# Patient Record
Sex: Female | Born: 1980
Health system: Southern US, Community
[De-identification: ages and names within clinical notes are randomized; demographics above are authoritative.]

## PROBLEM LIST (undated history)

## (undated) DIAGNOSIS — L409 Psoriasis, unspecified: Secondary | ICD-10-CM

## (undated) DIAGNOSIS — G8929 Other chronic pain: Secondary | ICD-10-CM

## (undated) DIAGNOSIS — N920 Excessive and frequent menstruation with regular cycle: Secondary | ICD-10-CM

## (undated) HISTORY — PX: TUBAL LIGATION: SHX77

## (undated) HISTORY — PX: CERVICAL SPINE SURGERY: SHX589

## (undated) HISTORY — PX: HEAD & NECK WOUND REPAIR / CLOSURE: SUR1142

---

## 2000-09-07 ENCOUNTER — Encounter: Payer: Self-pay | Admitting: Family Medicine

## 2000-09-07 ENCOUNTER — Ambulatory Visit (HOSPITAL_COMMUNITY): Admission: RE | Admit: 2000-09-07 | Discharge: 2000-09-07 | Payer: Self-pay | Admitting: Family Medicine

## 2001-03-29 ENCOUNTER — Other Ambulatory Visit: Admission: RE | Admit: 2001-03-29 | Discharge: 2001-03-29 | Payer: Self-pay | Admitting: Obstetrics and Gynecology

## 2001-05-06 ENCOUNTER — Ambulatory Visit (HOSPITAL_COMMUNITY): Admission: RE | Admit: 2001-05-06 | Discharge: 2001-05-06 | Payer: Self-pay | Admitting: Obstetrics and Gynecology

## 2001-08-31 ENCOUNTER — Inpatient Hospital Stay (HOSPITAL_COMMUNITY): Admission: RE | Admit: 2001-08-31 | Discharge: 2001-09-03 | Payer: Self-pay | Admitting: Internal Medicine

## 2002-07-09 ENCOUNTER — Other Ambulatory Visit: Admission: RE | Admit: 2002-07-09 | Discharge: 2002-07-09 | Payer: Self-pay

## 2002-08-30 ENCOUNTER — Emergency Department (HOSPITAL_COMMUNITY): Admission: EM | Admit: 2002-08-30 | Discharge: 2002-08-30 | Payer: Self-pay | Admitting: *Deleted

## 2002-09-25 ENCOUNTER — Other Ambulatory Visit: Admission: RE | Admit: 2002-09-25 | Discharge: 2002-09-25 | Payer: Self-pay | Admitting: Obstetrics and Gynecology

## 2004-03-20 ENCOUNTER — Emergency Department (HOSPITAL_COMMUNITY): Admission: EM | Admit: 2004-03-20 | Discharge: 2004-03-20 | Payer: Self-pay | Admitting: *Deleted

## 2004-04-20 ENCOUNTER — Emergency Department (HOSPITAL_COMMUNITY): Admission: EM | Admit: 2004-04-20 | Discharge: 2004-04-20 | Payer: Self-pay | Admitting: *Deleted

## 2004-08-31 ENCOUNTER — Emergency Department (HOSPITAL_COMMUNITY): Admission: EM | Admit: 2004-08-31 | Discharge: 2004-08-31 | Payer: Self-pay | Admitting: Emergency Medicine

## 2004-09-20 ENCOUNTER — Emergency Department (HOSPITAL_COMMUNITY): Admission: EM | Admit: 2004-09-20 | Discharge: 2004-09-20 | Payer: Self-pay | Admitting: *Deleted

## 2004-09-25 ENCOUNTER — Emergency Department (HOSPITAL_COMMUNITY): Admission: EM | Admit: 2004-09-25 | Discharge: 2004-09-26 | Payer: Self-pay | Admitting: Emergency Medicine

## 2005-08-10 ENCOUNTER — Emergency Department (HOSPITAL_COMMUNITY): Admission: EM | Admit: 2005-08-10 | Discharge: 2005-08-11 | Payer: Self-pay | Admitting: Emergency Medicine

## 2005-10-13 ENCOUNTER — Ambulatory Visit (HOSPITAL_COMMUNITY): Admission: RE | Admit: 2005-10-13 | Discharge: 2005-10-13 | Payer: Self-pay | Admitting: Obstetrics and Gynecology

## 2005-12-08 ENCOUNTER — Inpatient Hospital Stay (HOSPITAL_COMMUNITY): Admission: RE | Admit: 2005-12-08 | Discharge: 2005-12-10 | Payer: Self-pay | Admitting: Obstetrics and Gynecology

## 2006-11-04 ENCOUNTER — Emergency Department (HOSPITAL_COMMUNITY): Admission: EM | Admit: 2006-11-04 | Discharge: 2006-11-04 | Payer: Self-pay | Admitting: Emergency Medicine

## 2007-05-16 ENCOUNTER — Emergency Department (HOSPITAL_COMMUNITY): Admission: EM | Admit: 2007-05-16 | Discharge: 2007-05-17 | Payer: Self-pay | Admitting: Emergency Medicine

## 2007-06-08 ENCOUNTER — Emergency Department (HOSPITAL_COMMUNITY): Admission: EM | Admit: 2007-06-08 | Discharge: 2007-06-08 | Payer: Self-pay | Admitting: Emergency Medicine

## 2007-07-12 ENCOUNTER — Emergency Department (HOSPITAL_COMMUNITY): Admission: EM | Admit: 2007-07-12 | Discharge: 2007-07-12 | Payer: Self-pay | Admitting: Emergency Medicine

## 2007-12-28 ENCOUNTER — Emergency Department (HOSPITAL_COMMUNITY): Admission: EM | Admit: 2007-12-28 | Discharge: 2007-12-28 | Payer: Self-pay | Admitting: Emergency Medicine

## 2008-01-19 ENCOUNTER — Emergency Department (HOSPITAL_COMMUNITY): Admission: EM | Admit: 2008-01-19 | Discharge: 2008-01-20 | Payer: Self-pay | Admitting: Emergency Medicine

## 2008-01-20 ENCOUNTER — Emergency Department (HOSPITAL_COMMUNITY): Admission: EM | Admit: 2008-01-20 | Discharge: 2008-01-21 | Payer: Self-pay | Admitting: Emergency Medicine

## 2008-02-17 ENCOUNTER — Emergency Department (HOSPITAL_COMMUNITY): Admission: EM | Admit: 2008-02-17 | Discharge: 2008-02-18 | Payer: Self-pay | Admitting: Emergency Medicine

## 2008-03-03 ENCOUNTER — Emergency Department (HOSPITAL_COMMUNITY): Admission: EM | Admit: 2008-03-03 | Discharge: 2008-03-03 | Payer: Self-pay | Admitting: Emergency Medicine

## 2008-03-17 ENCOUNTER — Emergency Department (HOSPITAL_COMMUNITY): Admission: EM | Admit: 2008-03-17 | Discharge: 2008-03-17 | Payer: Self-pay | Admitting: Emergency Medicine

## 2008-04-03 ENCOUNTER — Emergency Department (HOSPITAL_COMMUNITY): Admission: EM | Admit: 2008-04-03 | Discharge: 2008-04-03 | Payer: Self-pay | Admitting: Emergency Medicine

## 2008-05-09 ENCOUNTER — Emergency Department (HOSPITAL_COMMUNITY): Admission: EM | Admit: 2008-05-09 | Discharge: 2008-05-09 | Payer: Self-pay | Admitting: Emergency Medicine

## 2008-05-12 ENCOUNTER — Emergency Department (HOSPITAL_COMMUNITY): Admission: EM | Admit: 2008-05-12 | Discharge: 2008-05-12 | Payer: Self-pay | Admitting: Emergency Medicine

## 2008-05-13 ENCOUNTER — Emergency Department (HOSPITAL_COMMUNITY): Admission: EM | Admit: 2008-05-13 | Discharge: 2008-05-13 | Payer: Self-pay | Admitting: Emergency Medicine

## 2008-05-15 ENCOUNTER — Emergency Department (HOSPITAL_COMMUNITY): Admission: EM | Admit: 2008-05-15 | Discharge: 2008-05-15 | Payer: Self-pay | Admitting: Internal Medicine

## 2008-05-17 ENCOUNTER — Emergency Department (HOSPITAL_COMMUNITY): Admission: EM | Admit: 2008-05-17 | Discharge: 2008-05-17 | Payer: Self-pay | Admitting: Emergency Medicine

## 2008-05-18 ENCOUNTER — Emergency Department (HOSPITAL_COMMUNITY): Admission: EM | Admit: 2008-05-18 | Discharge: 2008-05-18 | Payer: Self-pay | Admitting: Emergency Medicine

## 2008-06-23 ENCOUNTER — Ambulatory Visit (HOSPITAL_COMMUNITY): Admission: RE | Admit: 2008-06-23 | Discharge: 2008-06-23 | Payer: Self-pay | Admitting: Internal Medicine

## 2008-07-25 ENCOUNTER — Emergency Department (HOSPITAL_COMMUNITY): Admission: EM | Admit: 2008-07-25 | Discharge: 2008-07-25 | Payer: Self-pay | Admitting: Emergency Medicine

## 2008-07-27 ENCOUNTER — Emergency Department (HOSPITAL_COMMUNITY): Admission: EM | Admit: 2008-07-27 | Discharge: 2008-07-27 | Payer: Self-pay | Admitting: Emergency Medicine

## 2008-08-14 ENCOUNTER — Emergency Department (HOSPITAL_COMMUNITY): Admission: EM | Admit: 2008-08-14 | Discharge: 2008-08-14 | Payer: Self-pay | Admitting: Emergency Medicine

## 2008-09-24 ENCOUNTER — Emergency Department (HOSPITAL_COMMUNITY): Admission: EM | Admit: 2008-09-24 | Discharge: 2008-09-24 | Payer: Self-pay | Admitting: Emergency Medicine

## 2008-11-23 IMAGING — CT CT PELVIS W/O CM
1 of 2 series · 15 of 32 positions shown, 19 images · IV contrast (agent unspecified)
Comparison: none

CLINICAL DATA: Abdominal and pelvic pain.  Nausea, vomiting.  
 ABDOMEN CT WITHOUT CONTRAST:
TECHNIQUE: Multidetector CT imaging of the abdomen was performed following the standard protocol without IV contrast.  Intravenous contrast could not be administered due to lack of suitable intravenous access in this patient.
TECHNIQUE: Multidetector CT imaging of the pelvis was performed following the standard protocol without IV contrast.

[Series 5: abd|pel w/o 5.0 b40f · axial · non-contrast · 0.68mm/px · z∈[-452,-32]mm · 15 of 94 slices shown, 19 images]
[im 5/94  soft-tissue]
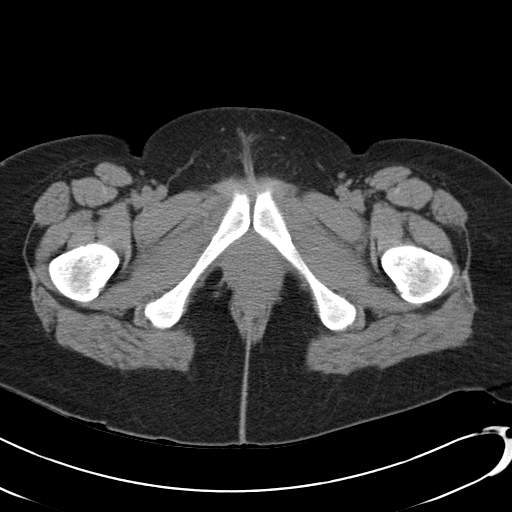
[im 5/94  bone]
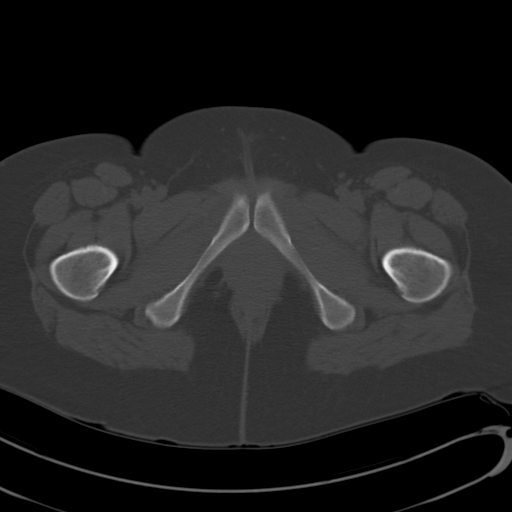
[im 13/94  soft-tissue]
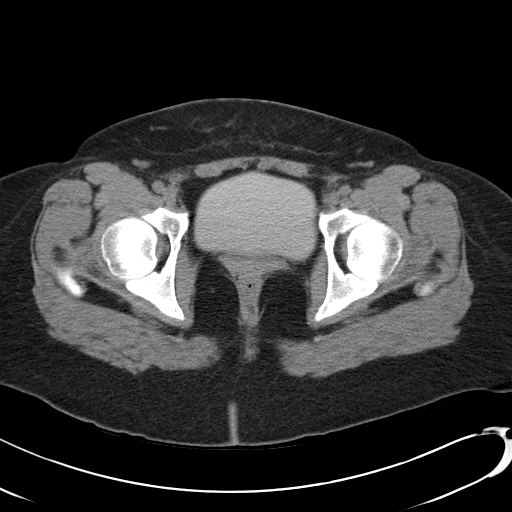
[im 21/94  soft-tissue]
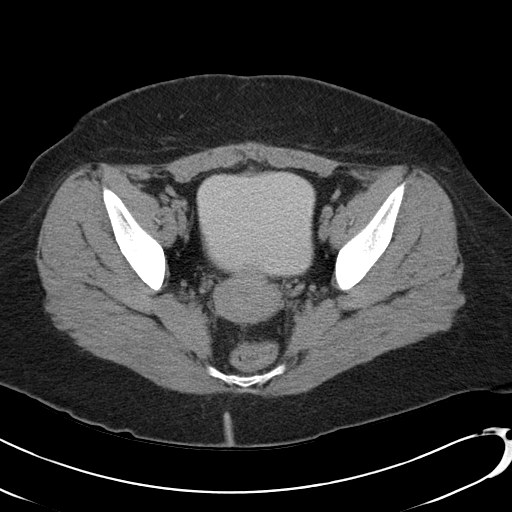
[im 25/94  soft-tissue]
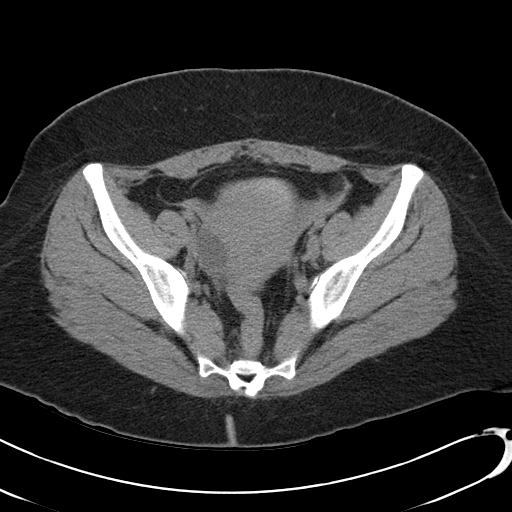
[im 33/94  soft-tissue]
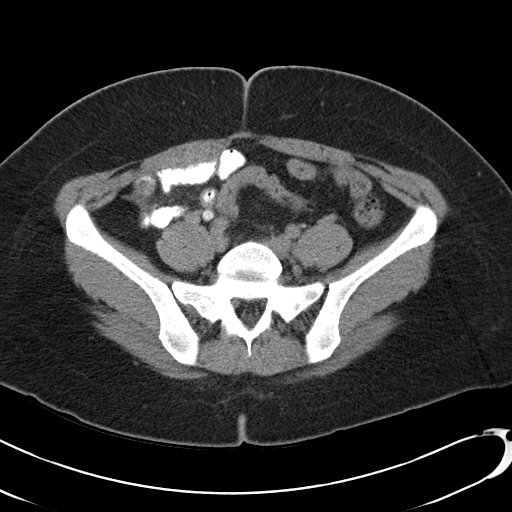
[im 41/94  soft-tissue]
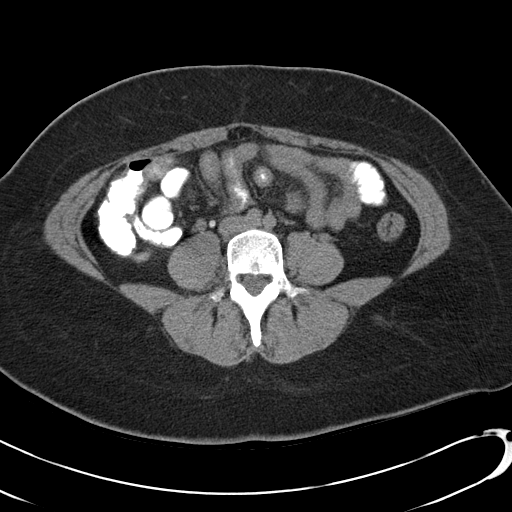
[im 49/94  soft-tissue]
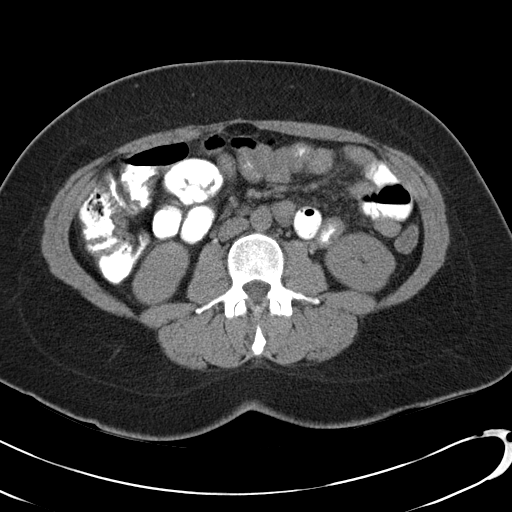
[im 53/94  soft-tissue]
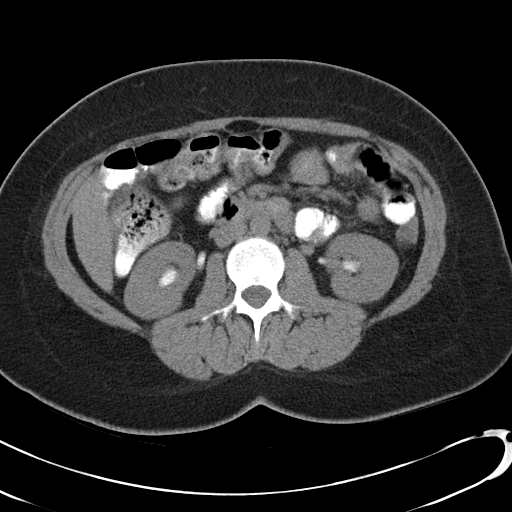
[im 61/94  soft-tissue]
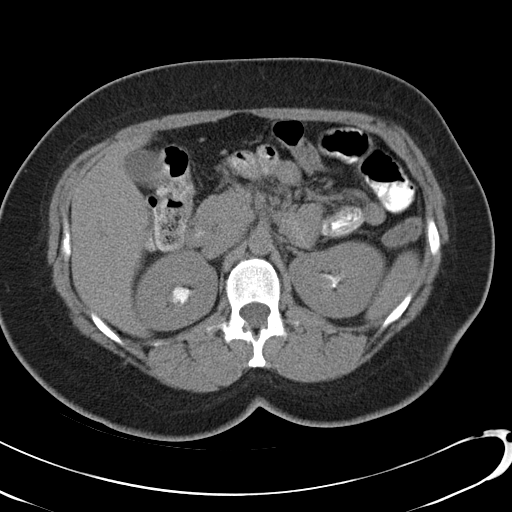
[im 61/94  bone]
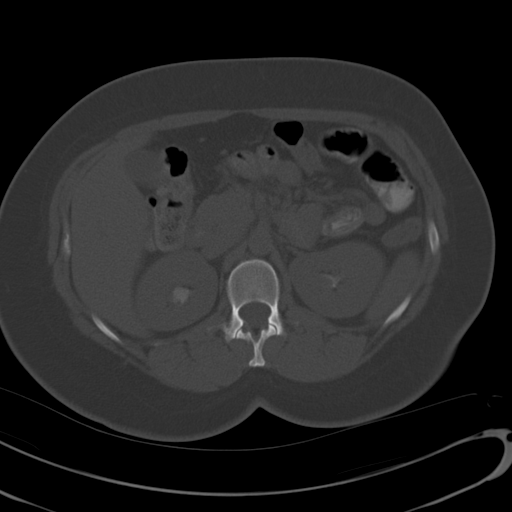
[im 69/94  soft-tissue]
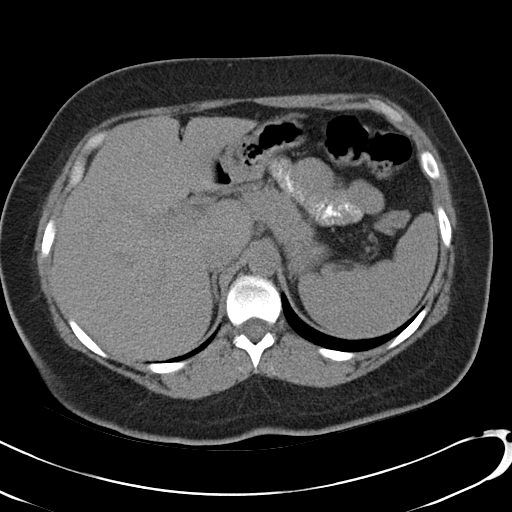
[im 73/94  soft-tissue]
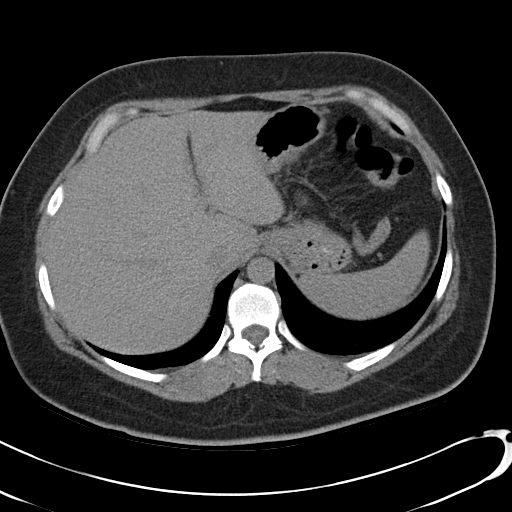
[im 77/94  lung]
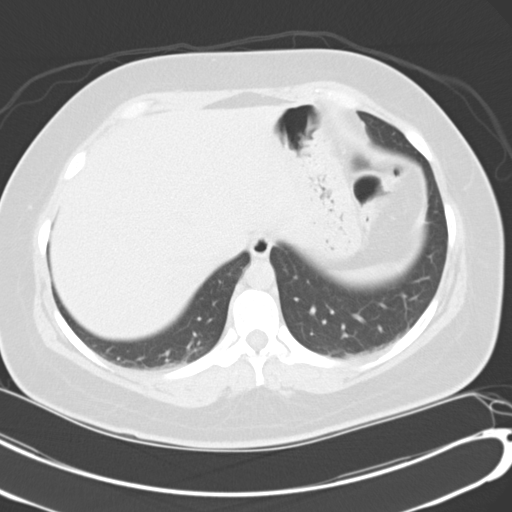
[im 81/94  soft-tissue]
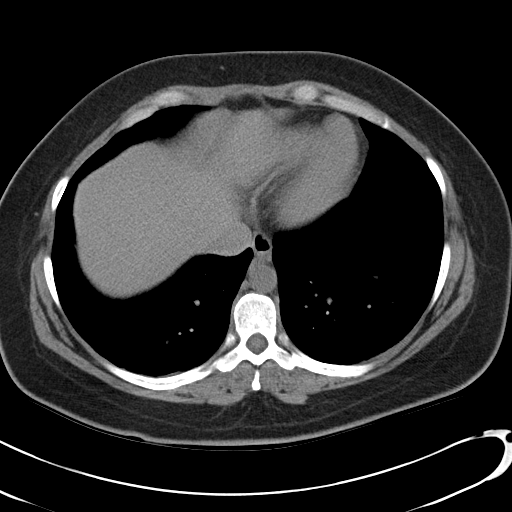
[im 81/94  lung]
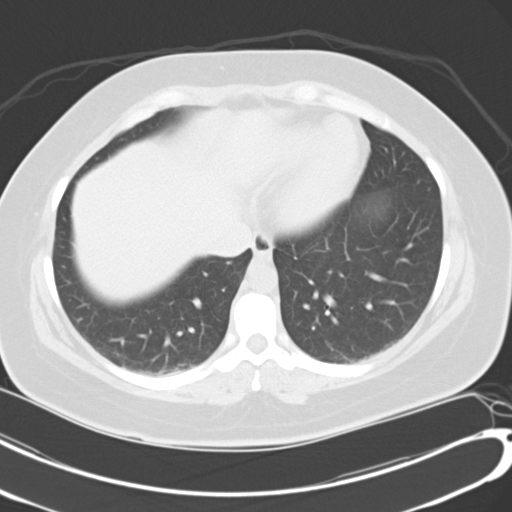
[im 85/94  lung]
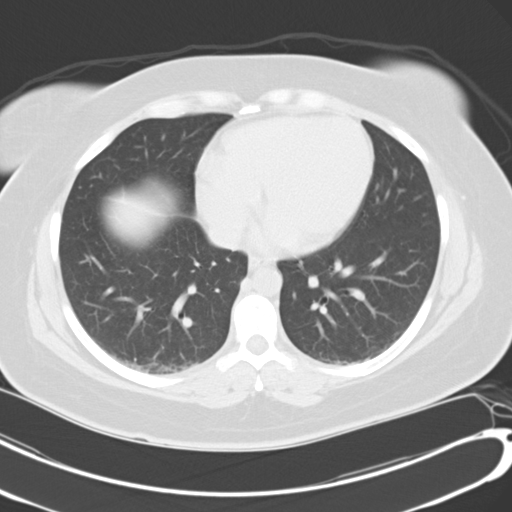
[im 89/94  soft-tissue]
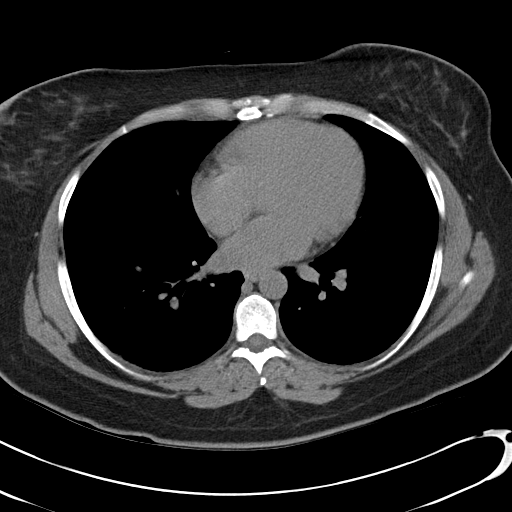
[im 89/94  lung]
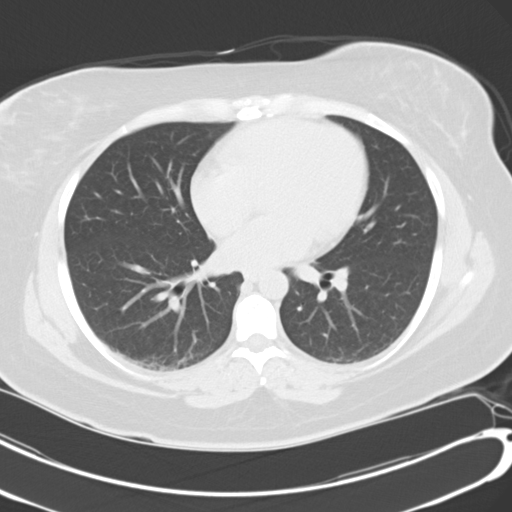

[15 of 32 positions shown; findings below may reference images not displayed]

FINDINGS: The abdominal parenchymal organs are normal in appearance.  There is no evidence of soft tissue mass or adenopathy within the abdomen.  There is no evidence of inflammatory process or abnormal fluid collections.  Bowel loops are unremarkable in appearance.
IMPRESSION: Negative abdomen CT.  No acute findings. 
 PELVIS CT WITHOUT CONTRAST:
FINDINGS: Normal appendix is visualized.  Right ovarian cyst is seen measuring approximately 2 x 3 cm.  There is no evidence of soft tissue mass or adenopathy.  There is no evidence of inflammatory process or abnormal fluid collections.
IMPRESSION: 3 cm right ovarian cyst.  Otherwise, unremarkable pelvis CT.

## 2008-12-01 ENCOUNTER — Emergency Department (HOSPITAL_COMMUNITY): Admission: EM | Admit: 2008-12-01 | Discharge: 2008-12-01 | Payer: Self-pay | Admitting: Emergency Medicine

## 2008-12-22 ENCOUNTER — Emergency Department (HOSPITAL_COMMUNITY): Admission: EM | Admit: 2008-12-22 | Discharge: 2008-12-22 | Payer: Self-pay | Admitting: Emergency Medicine

## 2009-02-10 ENCOUNTER — Emergency Department (HOSPITAL_COMMUNITY): Admission: EM | Admit: 2009-02-10 | Discharge: 2009-02-10 | Payer: Self-pay | Admitting: Emergency Medicine

## 2009-05-11 ENCOUNTER — Other Ambulatory Visit: Admission: RE | Admit: 2009-05-11 | Discharge: 2009-05-11 | Payer: Self-pay | Admitting: Obstetrics and Gynecology

## 2009-10-06 ENCOUNTER — Ambulatory Visit: Payer: Self-pay | Admitting: Family Medicine

## 2009-10-06 ENCOUNTER — Inpatient Hospital Stay (HOSPITAL_COMMUNITY): Admission: RE | Admit: 2009-10-06 | Discharge: 2009-10-09 | Payer: Self-pay | Admitting: Obstetrics and Gynecology

## 2010-06-20 ENCOUNTER — Encounter: Payer: Self-pay | Admitting: Internal Medicine

## 2010-08-17 LAB — TYPE AND SCREEN
ABO/RH(D): A NEG
Antibody Screen: POSITIVE
DAT, IgG: NEGATIVE

## 2010-08-17 LAB — CBC
HCT: 29.4 % — ABNORMAL LOW (ref 36.0–46.0)
HCT: 34.2 % — ABNORMAL LOW (ref 36.0–46.0)
Hemoglobin: 10.2 g/dL — ABNORMAL LOW (ref 12.0–15.0)
Hemoglobin: 11.8 g/dL — ABNORMAL LOW (ref 12.0–15.0)
MCHC: 34.3 g/dL (ref 30.0–36.0)
MCHC: 34.7 g/dL (ref 30.0–36.0)
MCV: 87.7 fL (ref 78.0–100.0)
MCV: 88.8 fL (ref 78.0–100.0)
Platelets: 203 10*3/uL (ref 150–400)
Platelets: 231 10*3/uL (ref 150–400)
RBC: 3.32 MIL/uL — ABNORMAL LOW (ref 3.87–5.11)
RBC: 3.9 MIL/uL (ref 3.87–5.11)
RDW: 15.6 % — ABNORMAL HIGH (ref 11.5–15.5)
RDW: 15.8 % — ABNORMAL HIGH (ref 11.5–15.5)
WBC: 13 10*3/uL — ABNORMAL HIGH (ref 4.0–10.5)
WBC: 13.5 10*3/uL — ABNORMAL HIGH (ref 4.0–10.5)

## 2010-08-17 LAB — RH IMMUNE GLOB WKUP(>/=20WKS)(NOT WOMEN'S HOSP): Fetal Screen: NEGATIVE

## 2010-08-17 LAB — RPR: RPR Ser Ql: NONREACTIVE

## 2010-09-05 LAB — PREGNANCY, URINE: Preg Test, Ur: NEGATIVE

## 2010-10-15 NOTE — Op Note (Signed)
[facility]  Patient:    Katelyn Hill, Katelyn Hill Visit Number: [id number] MRN: [id number]          Service Type: OBS Location: 4A A427 01 Attending Physician:  Tilda Burrow Dictated by:   Christin Bach, M.D. Proc. Date: 08/31/01 Admit Date:  08/31/2001   CC:         Dr. Gabriel Rung, [facility]   Operative Report  PREOPERATIVE DIAGNOSES:  Pregnancy [redacted] weeks gestation, repeat low transverse cervical cesarean section not for trial of labor. Obesity with panniculus.  POSTOPERATIVE DIAGNOSES:  Pregnancy [redacted] weeks gestation, repeat low transverse cervical cesarean section not for trial of labor. Obesity with panniculus.  PROCEDURE:  Repeat low transverse cervical cesarean section wide excision of cicatrix ([place]).  SURGEON:  Christin Bach, M.D.  ASSISTANTKarna Dupes, C.S.T.  ANESTHESIA:  Spinal, Minerva Areola, C.R.N.A.  COMPLICATIONS:  None.  ESTIMATED BLOOD LOSS:  600  FINDINGS:  A healthy female infant, Apgars 8 and 9, weight ______ managed by Dr. Gabriel Rung.  DESCRIPTION OF PROCEDURE:  The patient was taken to the operating room, prepped and draped for lower abdominal surgery, and after spinal anesthesia was instituted with Foley catheter inserted after anesthesia achieved. A Pfannenstiel-type incision was performed with wide excision to the cicatrix. We went in through the upper aspects of the incision which was approximately 4 cm above the old C-section scar. We incised down to the fascia, which was entered easily in a standard Pfannenstiel technique with easy development of bladder flap on the lower uterine segment. A transverse uterine incision was made, extended laterally using index finger traction and the fetal vertex rotated into the incision and delivered with vacuum extractor guidance and fundal pressure. The infant then had the cord clamped. The infant was then passed to Dr. Gabriel Rung for management, see her notes for details.  Cord  blood samples obtained and with the placenta delivered [facility] presentation intact with greater than usual bleeding until the Pitocin took effect. Single layer running locking closure of the uterus was followed by two figure-of-eight sutures at the left corner of the uterus to complete hemostasis. Bladder flap was loosely reapproximated with interrupted 2-0 chromic sutures x2. We irrigated the abdomen again with antibiotic solution as the uterus had been irrigated, then closed the anterior peritoneum with 2-0 chromic. The fascia was closed with #0 Vicryl.  Panniculus excision. We then went down to just below the old C section scar and excised a 5 cm wide x 25 cm width ellipse of skin and fibrous tissue and did so in such a way to allow for good cosmetic reapproximation. The patient then had a flat Jackson-Pratt drain placed above the fascia, point cautery used as necessary to confirm adequate hemostasis and then the fatty tissues reapproximated using interrupted 2-0 plain. Staple closure of the skin was performed without difficulty and the Jackson-Pratt drain allowed to exit through the left corner of the incision. The patient tolerated the procedure well and went to recovery in good condition. Dictated by:   Christin Bach, M.D. Attending Physician:  Tilda Burrow DD:  08/31/01 TD:  08/31/01 Job: [id number]

## 2010-10-15 NOTE — Discharge Summary (Signed)
[facility]  Patient:    Katelyn Hill, Katelyn Hill Visit Number: [id number] MRN: [id number]          Service Type: OBS Location: 4A A427 01 Attending Physician:  Tilda Burrow Dictated by:   Turner Daniels, M.D. Admit Date:  08/31/2001 Disc. Date: 09/03/01                             Discharge Summary  NO DICTATION Dictated by:   Turner Daniels, M.D. Attending Physician:  Tilda Burrow DD:  09/03/01 TD:  09/03/01 Job: [id number]

## 2010-10-15 NOTE — Discharge Summary (Signed)
Katelyn Hill, Katelyn Hill              ACCOUNT NO.:  [id number]   MEDICAL RECORD NO.:  [id number]          PATIENT TYPE:  INP   LOCATION:  A412                          FACILITY:  [facility]   PHYSICIAN:  Tilda Burrow, M.D. DATE OF BIRTH:  08-28-1980   DATE OF ADMISSION:  12/08/2005  DATE OF DISCHARGE:  07/14/2007LH                                 DISCHARGE SUMMARY   ADMISSION DIAGNOSIS:  1.  Pregnancy, 38 weeks 4 days, repeat cesarean section, not for trial of      labor.  2.  History of panic attacks.   DISCHARGE DIAGNOSIS:  1.  Pregnancy, 38 weeks 4 days, repeat cesarean section, not for trial of      labor.  2.  History of panic attacks.   PROCEDURE:  Repeat low transverse cervical cesarean section, wide excision  of old cicatrix.   DISCHARGE MEDICATIONS:  1.  Tylox, 30 tablets, 1 every 4-6 hours p.r.n. pain.  2.  Motrin 800 mg 1 p.o. q.8h. routinely x1 weeks.  3.  Chromagen Forte 1 p.o. b.i.d. x30 days.   FOLLOW UP:  Follow up in four days for drain removal and staple removal.   HOSPITAL SUMMARY:  Katelyn Hill was admitted for repeat cesarean section at 38 plus  4 weeks after an uneventful prenatal course.  The plan is for wide excision  of cicatrix done, once again, we have done this with each repeat C-section.  She was admitted with hemoglobin 11.6 and hematocrit 34.  Blood type A  negative.  She underwent a repeat cesarean section with 625 mL EBL,  delivering a healthy female infant, Apgars 9/9, cared for by Dr. Milinda Cave.  The baby's blood type was A positive.  Blood gas pH was 7.220, within normal  limits, pCO2 59.   Postpartum, the patient did well with only clear serous fluid from a [employer] drain.  Postop hemoglobin 10.7, hematocrit 31.2.  RhoGAM was  administered for blood type A positive infant.  She was stable for discharge  postop day two for follow up in four days.  Routine post surgical  instructions reviewed.      Tilda Burrow, M.D.  Electronically  Signed     JVF/MEDQ  D:  12/10/2005  T:  12/10/2005  Job:  [id number]   cc:   Jeoffrey Massed, MD  Fax: [phone]
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
## 2010-10-15 NOTE — Op Note (Signed)
NAME:  Katelyn Hill, Katelyn Hill              ACCOUNT NO.:  [id number]   MEDICAL RECORD NO.:  [id number]          PATIENT TYPE:  INP   LOCATION:  A412                          FACILITY:  [facility]   PHYSICIAN:  Tilda Burrow, M.D. DATE OF BIRTH:  [birth year]   DATE OF PROCEDURE:  12/08/2005  DATE OF DISCHARGE:                                 OPERATIVE REPORT   PREOPERATIVE DIAGNOSIS:  Pregnancy at 39 weeks.  Repeat cesarean section.  Not for trial of labor.   POSTOPERATIVE DIAGNOSIS:  Pregnancy at 39 weeks.  Repeat cesarean section.  Not for trial of labor.   PROCEDURE:  Repeat low transverse cervical cesarean section, wide excision  of cicatrix.   SURGEON:  Tilda Burrow, M.D.   ASSISTANTAmie Critchley, CST.   ANESTHESIA:  Spinal.   SPECIMENS:  Baby to nursery.  Pathology specimens:  None.   ESTIMATED BLOOD LOSS:  625 cc.   INDICATIONS:  Healthy female infant, Apgars 9 and 9.   DETAILS OF PROCEDURE:  The patient was taken to the operating room and  spinal anesthesia introduced.  The patient was adequately anesthetized for  procedure.  Lower abdominal incision was repeated with wide excision of  cicatrix over a 30 to 35-cm long ellipse of skin and underlying fatty  tissue, removing approximately 3 inches at its maximum width and contouring  the lower abdomen.  This was done quickly and then we made a Pfannenstiel  incision in standard fashion, opening the midline.  There was fairly  generous fibrosis and the bladder flap was quite high on the lower uterine  segment but easily taken down.  Transverse uterine incision was performed  and extended laterally using the finger traction.  Then, the baby delivered  using vacuum extractor and fundal pressure.  The infant was taken care of by  Dr. Milinda Cave.  See his notes for further details.  Apgars 9 and 9.  Weight  was determined later.   Cord blood samples were obtained and placenta delivered intact, Schultz  presentation, no complications  noted.  Transverse uterine incision was  closed with single layer of running locking closure with 0 chromic after  bladder flap closure after antibiotic irrigation of the uterine cavity.  Hemostasis was quite satisfactory.  Tissue approximation good, so bladder  flap was loosely approximated with 2-0 chromic.  The abdomen was irrigated  with antibiotic solution as the uterus had been.  The anterior peritoneum  closed with 2-0 chromic.  The rectus muscles were pulled together with two  interrupted sutures of 2-0 chromic and then fascia closed with continuous  running 0 Vicryl.  Subcu fatty tissues were revised to optimize closure of the lower abdomen.  Then, the subcu was reapproximated with interrupted 2-0 plain suture.  Flap  JP drain placed in the depth of the fatty incision defect and then staples  placed to the skin with suture.  Estimated blood loss 625.      Tilda Burrow, M.D.  Electronically Signed     JVF/MEDQ  D:  12/08/2005  T:  12/08/2005  Job:  [id number]   cc:  Jeoffrey Massed, MD  Fax: [phone]

## 2010-10-15 NOTE — Group Therapy Note (Signed)
NAME:  Katelyn Hill, Katelyn Hill              ACCOUNT NO.:  [id number]   MEDICAL RECORD NO.:  [id number]          PATIENT TYPE:  OIB   LOCATION:  A415                          FACILITY:  [facility]   PHYSICIAN:  Tilda Burrow, M.D. DATE OF BIRTH:  [birth year]   DATE OF PROCEDURE:  DATE OF DISCHARGE:  10/13/2005                                   PROGRESS NOTE   DICTATED BY: ______________   Katelyn Hill is about 30 weeks or so gestation, came in with complaints of  abdominal pain, nausea, and vomiting.  She got a dose of Phenergan  intramuscularly which worked well.  Her labs were within normal limits.  She  has had bouts of constipation and thought that it might be a source of her  abdominal pain.  She received a milk and molasses enema for which she had  good results.  Immediately following her bowel movements, her pain was  relieved.   IMPRESSION:  Intrauterine pregnancy at 30 week, constipation resolved.      Katelyn Hill, C.N.M.      Tilda Burrow, M.D.  Electronically Signed    FC/MEDQ  D:  10/13/2005  T:  10/14/2005  Job:  [id number]

## 2010-10-15 NOTE — Op Note (Signed)
NAMETAWNY, Katelyn Hill              ACCOUNT NO.:  [id number]   MEDICAL RECORD NO.:  [id number]          PATIENT TYPE:  INP   LOCATION:  A412                          FACILITY:  [facility]   PHYSICIAN:  Tilda Burrow, M.D. DATE OF BIRTH:  31-Mar-1981   DATE OF PROCEDURE:  12/08/2005  DATE OF DISCHARGE:  12/10/2005                                 OPERATIVE REPORT   PREOPERATIVE DIAGNOSES:  1.  Pregnancy at 38 weeks 4 days.  2.  Repeat C section, not in labor.  3.  Abdominal wall laxity.   POSTOPERATIVE DIAGNOSES:  1.  Pregnancy at 38 weeks 4 days.  2.  Repeat C section, not in labor.  3.  Abdominal wall laxity.   PROCEDURE:  Repeat low transverse cervical cesarean section, wide excision  of old cicatrix ([place]).   SURGEON:  Tilda Burrow, M.D.   ASSISTANT:  None.   ANESTHESIA:  General.   COMPLICATIONS:  None.   PROCEDURE:  Low-transverse cervical cesarean section and wide excision of  cicatrix, Jannifer Franklin, December 08, 2005.   DISCHARGE MEDICATIONS:  1.  Tylox 1 q.4 h p.r.n. pain.  2.  Chromagen forte 1 p.o. daily x30 days.   HOSPITAL SUMMARY:  This 30 year old, gravida 3, para 2, 2 prior sections  admitted for repeat C-section as described in the HPI.   DESCRIPTION OF PROCEDURE:  The patient taken to the operating room, prepped  and draped. A lower abdominal incision repeated with wide excision, with  good cosmetic symmetry of incision edge is anticipated.  A Pfannenstiel type  incision was repeated, and the peritoneal cavity entered easily.  A bladder  flap was developed and transverse lower uterine segment incision performed  in standard fashion with lateral traction used to open the incision  sufficiently to deliver the fetal vertex with fundal pressure applied.  The  infant was delivered, bulb suctioning performed of the pharynx and then cord  blood samples obtained after the cord was clamped.  The placenta delivered  intact, Tomasa Blase presentation, three-vessel cord  confirmed.  The uterus was  irrigated with antibiotic solution, then closed using single layer running  locking closure with #0 chromic suture.  The patient had good hemostasis.  The bladder flap was reapproximated with 2-0 chromic.  The abdomen was  irrigated with antibiotics.  The peritoneal cavity was closed anteriorly  with 2-0 chromic, the fascia reapproximated with running #0 Vicryl, the  subcutaneous tissues reapproximated with interrupted 2-0 plain and staple  closure of the skin completed the procedure.  The patient tolerated the  procedure well and went to the recovery room in good condition.  Sponge and  needle counts were correct. For notes on the infant, see Dr. Webb Laws notes  dictated elsewhere.      Tilda Burrow, M.D.  Electronically Signed     JVF/MEDQ  D:  01/02/2006  T:  01/03/2006  Job:  [id number]   cc:   Francoise Schaumann. Raynelle Highland  Fax: [phone]   Dr. Letitia Caul, [facility]

## 2010-10-15 NOTE — Discharge Summary (Signed)
[facility]  Patient:    EMIKO, OSORTO Visit Number: [id number] MRN: [id number]          Service Type: OBS Location: 4A A427 01 Attending Physician:  Tilda Burrow Dictated by:   Turner Daniels, M.D. Admit Date:  08/31/2001 Disc. Date: 09/03/01                             Discharge Summary  DISCHARGE DIAGNOSES: 1. Status post repeat cesarean section. 2. Excision of cicatrix. 3. Previous cesarean section.  PROCEDURES: 1. Repeat cesarean section. 2. Excision of cicatrix.  HISTORY OF PRESENT ILLNESS:  Please refer to the transcribed history and physical, the antepartum chart, and operative note for details of admission to the hospital.  HOSPITAL COURSE:  Patient was admitted postoperatively in good, stable condition.  She had an unremarkable postoperative course.  She remained afebrile, had stable vital signs, tolerated clear liquids and a regular diet, voided with symptoms, ambulated without symptoms, and had progression of a normal bowel function with flatus.  Her hemoglobin and hematocrit were 11.4 and 32.2 on postoperative day #1 with a white count of 7500.  Her incision remained clean, dry, and intact throughout the postoperative course.  She had minimal JP drainage.  Her abdominal examination was benign.  She was discharged to home on the postoperative day #3 in good, stable condition.  She was given 40 Tylox, 20 Motrin to take at home for pain and instructed to come back to the office on Friday for management of her incision.  She was given instructions and precautions for return prior to that time. Dictated by:   Turner Daniels, M.D. Attending Physician:  Tilda Burrow DD:  09/03/01 TD:  09/03/01 Job: [id number]

## 2011-02-25 LAB — URINE MICROSCOPIC-ADD ON

## 2011-02-25 LAB — URINALYSIS, ROUTINE W REFLEX MICROSCOPIC
Bilirubin Urine: NEGATIVE
Glucose, UA: NEGATIVE
Ketones, ur: NEGATIVE
Leukocytes, UA: NEGATIVE
Nitrite: NEGATIVE
Protein, ur: NEGATIVE
Specific Gravity, Urine: 1.025
Urobilinogen, UA: 0.2
pH: 6

## 2011-02-25 LAB — PREGNANCY, URINE: Preg Test, Ur: NEGATIVE

## 2011-03-01 LAB — CULTURE, ROUTINE-ABSCESS

## 2011-03-04 LAB — PREGNANCY, URINE: Preg Test, Ur: NEGATIVE

## 2011-03-04 LAB — URINALYSIS, ROUTINE W REFLEX MICROSCOPIC
Bilirubin Urine: NEGATIVE
Glucose, UA: NEGATIVE
Ketones, ur: NEGATIVE
Nitrite: POSITIVE — AB
Protein, ur: NEGATIVE
Specific Gravity, Urine: 1.025
Urobilinogen, UA: 0.2
pH: 6

## 2011-03-04 LAB — DIFFERENTIAL
Basophils Absolute: 0.1
Basophils Relative: 1
Eosinophils Absolute: 0.1 — ABNORMAL LOW
Eosinophils Relative: 1
Lymphocytes Relative: 25
Lymphs Abs: 2.1
Monocytes Absolute: 0.6
Monocytes Relative: 7
Neutro Abs: 5.8
Neutrophils Relative %: 67

## 2011-03-04 LAB — URINE MICROSCOPIC-ADD ON

## 2011-03-04 LAB — CBC
HCT: 43.4
Hemoglobin: 14.8
MCHC: 34.1
MCV: 87.2
Platelets: 272
RBC: 4.98
RDW: 13.2
WBC: 8.6

## 2011-03-04 LAB — BASIC METABOLIC PANEL
BUN: 10
CO2: 25
Calcium: 9.3
Chloride: 103
Creatinine, Ser: 0.63
GFR calc Af Amer: >60
GFR calc non Af Amer: >60
Glucose, Bld: 102 — ABNORMAL HIGH
Potassium: 4
Sodium: 137

## 2011-04-06 ENCOUNTER — Encounter: Payer: Self-pay | Admitting: *Deleted

## 2011-04-06 ENCOUNTER — Emergency Department (HOSPITAL_COMMUNITY)
Admission: EM | Admit: 2011-04-06 | Discharge: 2011-04-06 | Disposition: A | Discharge status: Home / Self Care | Payer: Self-pay | Attending: Emergency Medicine | Admitting: Emergency Medicine

## 2011-04-06 DIAGNOSIS — R51 Headache: Secondary | ICD-10-CM | POA: Insufficient documentation

## 2011-04-06 HISTORY — DX: Other chronic pain: G89.29

## 2011-04-06 MED ORDER — METOCLOPRAMIDE HCL 5 MG/ML IJ SOLN
10.0000 mg | Freq: Once | INTRAMUSCULAR | Status: AC
  Administered 2011-04-06: 10 mg via INTRAMUSCULAR
  Filled 2011-04-06: qty 2

## 2011-04-06 MED ORDER — OXYCODONE-ACETAMINOPHEN 5-325 MG PO TABS
2.0000 | ORAL_TABLET | Freq: Once | ORAL | Status: AC
  Administered 2011-04-06: 2 via ORAL
  Filled 2011-04-06: qty 2

## 2011-04-06 MED ORDER — DIPHENHYDRAMINE HCL 25 MG PO CAPS
50.0000 mg | ORAL_CAPSULE | Freq: Once | ORAL | Status: AC
  Administered 2011-04-06: 50 mg via ORAL
  Filled 2011-04-06: qty 2

## 2011-04-06 MED ORDER — KETOROLAC TROMETHAMINE 60 MG/2ML IM SOLN
60.0000 mg | Freq: Once | INTRAMUSCULAR | Status: AC
  Administered 2011-04-06: 60 mg via INTRAMUSCULAR
  Filled 2011-04-06: qty 2

## 2011-04-06 NOTE — ED Notes (Signed)
Medicated for pain as ordered.

## 2011-04-06 NOTE — ED Notes (Signed)
C/o left sided headace and neck pain onset yesterday; reports hx of migraine HA and states this pain is similar.  Also reports, "my legs gave out from under me earlier" causing fall; denies LOC. C/o n/v with pain; refuses to get in bed-wants to stay in wheelchair; requesting shot for pain.

## 2011-04-06 NOTE — ED Notes (Signed)
Pt left prior to receiving discharge papers.

## 2011-04-06 NOTE — ED Notes (Signed)
Left in c/o husband for transport home; alert, in no distress.
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
## 2011-04-08 NOTE — ED Provider Notes (Signed)
Medical screening examination/treatment/procedure(s) were performed by non-physician practitioner and as supervising physician I was immediately available for consultation/collaboration.   Joya Gaskins, MD 04/08/11 [phone]

## 2011-07-30 ENCOUNTER — Emergency Department (HOSPITAL_COMMUNITY)
Admission: EM | Admit: 2011-07-30 | Discharge: 2011-07-30 | Disposition: A | Discharge status: Home / Self Care | Payer: Self-pay | Attending: Emergency Medicine | Admitting: Emergency Medicine

## 2011-07-30 ENCOUNTER — Encounter (HOSPITAL_COMMUNITY): Payer: Self-pay | Admitting: *Deleted

## 2011-07-30 DIAGNOSIS — N949 Unspecified condition associated with female genital organs and menstrual cycle: Secondary | ICD-10-CM | POA: Insufficient documentation

## 2011-07-30 DIAGNOSIS — R51 Headache: Secondary | ICD-10-CM | POA: Insufficient documentation

## 2011-07-30 DIAGNOSIS — L089 Local infection of the skin and subcutaneous tissue, unspecified: Secondary | ICD-10-CM

## 2011-07-30 DIAGNOSIS — N390 Urinary tract infection, site not specified: Secondary | ICD-10-CM | POA: Insufficient documentation

## 2011-07-30 DIAGNOSIS — N764 Abscess of vulva: Secondary | ICD-10-CM | POA: Insufficient documentation

## 2011-07-30 DIAGNOSIS — A6000 Herpesviral infection of urogenital system, unspecified: Secondary | ICD-10-CM | POA: Insufficient documentation

## 2011-07-30 DIAGNOSIS — G8929 Other chronic pain: Secondary | ICD-10-CM | POA: Insufficient documentation

## 2011-07-30 LAB — URINE MICROSCOPIC-ADD ON

## 2011-07-30 LAB — URINALYSIS, ROUTINE W REFLEX MICROSCOPIC
Bilirubin Urine: NEGATIVE
Glucose, UA: NEGATIVE mg/dL
Ketones, ur: NEGATIVE mg/dL
Leukocytes, UA: NEGATIVE
Nitrite: NEGATIVE
Protein, ur: NEGATIVE mg/dL
Specific Gravity, Urine: 1.025 (ref 1.005–1.030)
Urobilinogen, UA: 0.2 mg/dL (ref 0.0–1.0)
pH: 6 (ref 5.0–8.0)

## 2011-07-30 MED ORDER — SULFAMETHOXAZOLE-TRIMETHOPRIM 800-160 MG PO TABS: 1.0000 | ORAL_TABLET | Freq: Two times a day (BID) | ORAL | Status: AC

## 2011-07-30 MED ORDER — VALACYCLOVIR HCL 1 G PO TABS: 1000.0000 mg | ORAL_TABLET | Freq: Three times a day (TID) | ORAL | Status: AC

## 2011-07-30 MED ORDER — TRAMADOL HCL 50 MG PO TABS: ORAL_TABLET | ORAL | Status: DC

## 2011-07-30 NOTE — ED Provider Notes (Signed)
History     CSN: [id number]  Arrival date & time 07/30/11  1457   First MD Initiated Contact with Patient 07/30/11 1703      Chief Complaint  Patient presents with  . Abscess    (Consider location/radiation/quality/duration/timing/severity/associated sxs/prior treatment) HPI Comments: Patient states that she has had approximately 3 days of increasing pain in the vulva to suprapubic area after shaving with a NARE. She noticed a raised red area present. In this area is becoming more and more painful. The patient states that she has a history of herpes simplex to but usually does not have any problem with this. She is concerned as to whether or not it is related to the shaving, or related to herpes. The patient also complains of some dysuria and would like to have her urine checked.  Patient is a 31 y.o. female presenting with abscess. The history is provided by the patient.  Abscess  Pertinent negatives include no cough.    Past Medical History  Diagnosis Date  . Migraine   . Chronic pain     Past Surgical History  Procedure Date  . Head & neck wound repair / closure   . Cervical spine surgery     No family history on file.  History  Substance Use Topics  . Smoking status: Current Everyday Smoker -- 1.0 packs/day    Types: Cigarettes  . Smokeless tobacco: Not on file  . Alcohol Use: No    OB History    Grav Para Term Preterm Abortions TAB SAB Ect Mult Living                  Review of Systems  Constitutional: Negative for activity change.       All ROS Neg except as noted in HPI  HENT: Negative for nosebleeds and neck pain.   Eyes: Negative for photophobia and discharge.  Respiratory: Negative for cough, shortness of breath and wheezing.   Cardiovascular: Negative for chest pain and palpitations.  Gastrointestinal: Negative for abdominal pain and blood in stool.  Genitourinary: Negative for dysuria, frequency and hematuria.  Musculoskeletal: Negative for back  pain and arthralgias.  Skin: Negative.        Herpes  Neurological: Positive for headaches. Negative for dizziness, seizures and speech difficulty.  Psychiatric/Behavioral: Negative for hallucinations and confusion.    Allergies  Tylenol  Home Medications   Current Outpatient Rx  Name Route Sig Dispense Refill  . IBUPROFEN 200 MG PO TABS Oral Take 800 mg by mouth as needed. For pain     . SULFAMETHOXAZOLE-TRIMETHOPRIM 800-160 MG PO TABS Oral Take 1 tablet by mouth 2 (two) times daily. 14 tablet 0  . TRAMADOL HCL 50 MG PO TABS  1 or 2 po q6h prn pain 20 tablet 0  . VALACYCLOVIR HCL 1 G PO TABS Oral Take 1 tablet (1,000 mg total) by mouth 3 (three) times daily. 21 tablet 0    BP 123/58  Pulse 92  Temp 98.1 F (36.7 C)  Resp 16  Ht 5\' 1"  (1.549 m)  Wt 180 lb (81.647 kg)  BMI 34.01 kg/m2  SpO2 100%  LMP 07/16/2011  Physical Exam  Nursing note and vitals reviewed. Constitutional: She is oriented to person, place, and time. She appears well-developed and well-nourished.  Non-toxic appearance.  HENT:  Head: Normocephalic.  Right Ear: Tympanic membrane and external ear normal.  Left Ear: Tympanic membrane and external ear normal.  Eyes: EOM and lids are normal. Pupils are  equal, round, and reactive to light.  Neck: Normal range of motion. Neck supple. Carotid bruit is not present.  Cardiovascular: Normal rate, regular rhythm, normal heart sounds, intact distal pulses and normal pulses.   Pulmonary/Chest: Breath sounds normal. No respiratory distress.  Abdominal: Soft. Bowel sounds are normal. There is no tenderness. There is no guarding.       No CVA tenderness.  Genitourinary:       There is a small red raised area at the left upper vulva area. Mild to moderate tenderness to palpation. No other satellite lesions appreciated. There is no blisters noted. And no grouping of lesions.  Musculoskeletal: Normal range of motion.  Lymphadenopathy:       Head (right side): No  submandibular adenopathy present.       Head (left side): No submandibular adenopathy present.    She has no cervical adenopathy.  Neurological: She is alert and oriented to person, place, and time. She has normal strength. No cranial nerve deficit or sensory deficit.  Skin: Skin is warm and dry.  Psychiatric: She has a normal mood and affect. Her speech is normal.    ED Course  Procedures (including critical care time)  Labs Reviewed  URINALYSIS, ROUTINE W REFLEX MICROSCOPIC - Abnormal; Notable for the following:    Hgb urine dipstick MODERATE (*)    All other components within normal limits  URINE MICROSCOPIC-ADD ON - Abnormal; Notable for the following:    Squamous Epithelial / LPF FEW (*)    All other components within normal limits   No results found.   1. Skin infection   2. Pain of female symphysis pubis   3. UTI (lower urinary tract infection)       MDM  I have reviewed nursing notes, vital signs, and all appropriate lab and imaging results for this patient. The patient has an infected area in the vulva/pubis on the left. Patient is advised to use warm tub soaks daily. She is also given a prescription for Septra DS Valtrex, and Ultram for pain. Patient is [facility] for recheck.       Kathie Dike, [place] 07/30/11 1744

## 2011-07-30 NOTE — ED Notes (Signed)
Pt states abscess to vaginal area after shaving the area. Pt also states "urine is a weird color and smells funky" NAD. Denies any pain with urination, abd pain, or flank pain.

## 2011-07-30 NOTE — ED Notes (Signed)
Pt states that she has a cyst to her vaginal region. Pt states that she noticed it after doing a hair removal. Pt states that she was told she once had herpes. Pt alert and oriented x 3 and ambulated to the ED without complications.

## 2011-07-30 NOTE — Discharge Instructions (Signed)
Please [facility] for recheck in 1 week. Septra daily until all taken. Urinary Tract Infection A urinary tract infection (UTI) is often caused by a germ (bacteria). A UTI is usually helped with medicine (antibiotics) that kills germs. Take all the medicine until it is gone. Do this even if you are feeling better. You are usually better in 7 to 10 days. HOME CARE   Drink enough water and fluids to keep your pee (urine) clear or pale yellow. Drink:   Cranberry juice.   Water.   Avoid:   Caffeine.   Tea.   Bubbly (carbonated) drinks.   Alcohol.   Only take medicine as told by your doctor.   To prevent further infections:   Pee often.   After pooping (bowel movement), women should wipe from front to back. Use each tissue only once.   Pee before and after having sex (intercourse).  Ask your doctor when your test results will be ready. Make sure you follow up and get your test results.  GET HELP RIGHT AWAY IF:   There is very bad back pain or lower belly (abdominal) pain.   You get the chills.   You have a fever.   Your baby is older than 3 months with a rectal temperature of 102 F (38.9 C) or higher.   Your baby is 39 months old or younger with a rectal temperature of 100.4 F (38 C) or higher.   You feel sick to your stomach (nauseous) or throw up (vomit).   There is continued burning with peeing.   Your problems are not better in 3 days. Return sooner if you are getting worse.  MAKE SURE YOU:   Understand these instructions.   Will watch your condition.   Will get help right away if you are not doing well or get worse.  Document Released: 11/02/2007 Document Revised: 01/26/2011 Document Reviewed: 11/02/2007 [facility] Patient Information 2012 [place], [place].

## 2011-07-30 NOTE — ED Notes (Signed)
Pt a/ox4. Resp even and unlabored. NAD at this time. D/C instructions reviewed with pt. Pt verbalized understanding. Pt ambulated to lobby with steady gate.  

## 2011-07-31 NOTE — ED Provider Notes (Signed)
Medical screening examination/treatment/procedure(s) were performed by non-physician practitioner and as supervising physician I was immediately available for consultation/collaboration.   Ayriel Texidor L Zenaya Ulatowski, MD 07/31/11 1118 

## 2013-02-24 ENCOUNTER — Encounter (HOSPITAL_COMMUNITY): Payer: Self-pay | Admitting: Emergency Medicine

## 2013-02-24 ENCOUNTER — Emergency Department (HOSPITAL_COMMUNITY)
Admission: EM | Admit: 2013-02-24 | Discharge: 2013-02-24 | Disposition: A | Discharge status: Home / Self Care | Payer: Self-pay | Attending: Emergency Medicine | Admitting: Emergency Medicine

## 2013-02-24 DIAGNOSIS — Z8669 Personal history of other diseases of the nervous system and sense organs: Secondary | ICD-10-CM | POA: Insufficient documentation

## 2013-02-24 DIAGNOSIS — K029 Dental caries, unspecified: Secondary | ICD-10-CM | POA: Insufficient documentation

## 2013-02-24 DIAGNOSIS — G8929 Other chronic pain: Secondary | ICD-10-CM | POA: Insufficient documentation

## 2013-02-24 DIAGNOSIS — F17200 Nicotine dependence, unspecified, uncomplicated: Secondary | ICD-10-CM | POA: Insufficient documentation

## 2013-02-24 MED ORDER — AMOXICILLIN 250 MG PO CAPS
500.0000 mg | ORAL_CAPSULE | Freq: Once | ORAL | Status: AC
  Administered 2013-02-24: 500 mg via ORAL
  Filled 2013-02-24: qty 2

## 2013-02-24 MED ORDER — TRAMADOL HCL 50 MG PO TABS: 50.0000 mg | ORAL_TABLET | Freq: Four times a day (QID) | ORAL | Status: DC | PRN

## 2013-02-24 MED ORDER — KETOROLAC TROMETHAMINE 60 MG/2ML IM SOLN
60.0000 mg | Freq: Once | INTRAMUSCULAR | Status: AC
  Administered 2013-02-24: 60 mg via INTRAMUSCULAR
  Filled 2013-02-24: qty 2

## 2013-02-24 MED ORDER — AMOXICILLIN 500 MG PO CAPS: 500.0000 mg | ORAL_CAPSULE | Freq: Three times a day (TID) | ORAL | Status: DC

## 2013-02-24 NOTE — ED Provider Notes (Signed)
CSN: [id number]     Arrival date & time 02/24/13  1523 History   First MD Initiated Contact with Patient 02/24/13 1531     Chief Complaint  Patient presents with  . Dental Pain   (Consider location/radiation/quality/duration/timing/severity/associated sxs/prior Treatment) Patient is a 32 y.o. female presenting with tooth pain. The history is provided by the patient.  Dental Pain Location:  Lower Lower teeth location:  17/LL 3rd molar and 22/LL cuspid Quality:  Throbbing and constant Onset quality:  Gradual Duration:  2 days Timing:  Constant Progression:  Worsening Chronicity:  Chronic Context: dental caries   Relieved by:  Nothing Worsened by:  Cold food/drink and pressure Ineffective treatments:  NSAIDs Associated symptoms: facial pain   Associated symptoms: no facial swelling, no fever and no headaches   Risk factors: smoking    Katelyn Hill is a 32 y.o. female who presents to the ED with dental pain. She has had chronic problems but this episode started 2 days ago and today is severe.   Past Medical History  Diagnosis Date  . Migraine   . Chronic pain    Past Surgical History  Procedure Laterality Date  . Head & neck wound repair / closure    . Cervical spine surgery     History reviewed. No pertinent family history. History  Substance Use Topics  . Smoking status: Current Every Day Smoker -- 1.00 packs/day for 19 years    Types: Cigarettes  . Smokeless tobacco: Never Used  . Alcohol Use: Yes     Comment: occas   OB History   Grav Para Term Preterm Abortions TAB SAB Ect Mult Living   4 4 4             Review of Systems  Constitutional: Negative for fever.  HENT: Positive for dental problem. Negative for facial swelling.   Gastrointestinal: Negative for nausea and vomiting.  Genitourinary: Negative for frequency.  Musculoskeletal: Negative for back pain.  Skin: Negative for rash.  Neurological: Negative for headaches.  Psychiatric/Behavioral: The  patient is not nervous/anxious.     Allergies  Tylenol  Home Medications   Current Outpatient Rx  Name  Route  Sig  Dispense  Refill  . ibuprofen (ADVIL,MOTRIN) 200 MG tablet   Oral   Take 800 mg by mouth as needed. For pain          . traMADol (ULTRAM) 50 MG tablet      1 or 2 po q6h prn pain   20 tablet   0    BP 113/59  Pulse 65  Temp(Src) 98.3 F (36.8 C) (Oral)  Resp 16  Ht 5\' 1"  (1.549 m)  Wt 200 lb (90.719 kg)  BMI 37.81 kg/m2  SpO2 100%  LMP 02/09/2013 Physical Exam  Nursing note and vitals reviewed. Constitutional: She is oriented to person, place, and time. She appears well-developed and well-nourished. No distress.  HENT:  Head: Normocephalic.  Mouth/Throat: Uvula is midline, oropharynx is clear and moist and mucous membranes are normal. Dental caries present.    Eyes: EOM are normal.  Neck: Normal range of motion. Neck supple.  Cardiovascular: Normal rate, regular rhythm and normal heart sounds.   Pulmonary/Chest: Effort normal and breath sounds normal.  Abdominal: Soft. There is no tenderness.  Musculoskeletal: Normal range of motion.  Lymphadenopathy:    She has no cervical adenopathy.  Neurological: She is alert and oriented to person, place, and time. No cranial nerve deficit.  Skin: Skin is warm  and dry.  Psychiatric: She has a normal mood and affect. Her behavior is normal.    ED Course  Procedures  MDM  32 y.o. female with dental pain due to caries. Will treat for infection and pain. Patient advised to see a dentist as soon as possible. Patient voices understanding. Patient stable for discharge home without any immediate complications.   Medication List    TAKE these medications       amoxicillin 500 MG capsule  Commonly known as:  AMOXIL  Take 1 capsule (500 mg total) by mouth 3 (three) times daily.     traMADol 50 MG tablet  Commonly known as:  ULTRAM  Take 1 tablet (50 mg total) by mouth every 6 (six) hours as needed for  pain.      ASK your doctor about these medications       ibuprofen 200 MG tablet  Commonly known as:  ADVIL,MOTRIN  Take 800 mg by mouth every 8 (eight) hours as needed for pain.           [facility] Orlene Och, [place] 02/25/13 1759

## 2013-02-24 NOTE — ED Notes (Signed)
Pt c/o onset of dental pain yesterday. L lower jaw.

## 2013-02-25 ENCOUNTER — Emergency Department (HOSPITAL_COMMUNITY)
Admission: EM | Admit: 2013-02-25 | Discharge: 2013-02-25 | Disposition: A | Discharge status: Home / Self Care | Payer: Self-pay | Attending: Emergency Medicine | Admitting: Emergency Medicine

## 2013-02-25 ENCOUNTER — Encounter (HOSPITAL_COMMUNITY): Payer: Self-pay

## 2013-02-25 ENCOUNTER — Encounter (HOSPITAL_COMMUNITY): Payer: Self-pay | Admitting: Emergency Medicine

## 2013-02-25 DIAGNOSIS — Z8679 Personal history of other diseases of the circulatory system: Secondary | ICD-10-CM | POA: Insufficient documentation

## 2013-02-25 DIAGNOSIS — R51 Headache: Secondary | ICD-10-CM | POA: Insufficient documentation

## 2013-02-25 DIAGNOSIS — G8929 Other chronic pain: Secondary | ICD-10-CM | POA: Insufficient documentation

## 2013-02-25 DIAGNOSIS — Z791 Long term (current) use of non-steroidal anti-inflammatories (NSAID): Secondary | ICD-10-CM | POA: Insufficient documentation

## 2013-02-25 DIAGNOSIS — R220 Localized swelling, mass and lump, head: Secondary | ICD-10-CM | POA: Insufficient documentation

## 2013-02-25 DIAGNOSIS — K0889 Other specified disorders of teeth and supporting structures: Secondary | ICD-10-CM

## 2013-02-25 DIAGNOSIS — K089 Disorder of teeth and supporting structures, unspecified: Secondary | ICD-10-CM | POA: Insufficient documentation

## 2013-02-25 DIAGNOSIS — K029 Dental caries, unspecified: Secondary | ICD-10-CM | POA: Insufficient documentation

## 2013-02-25 DIAGNOSIS — Z792 Long term (current) use of antibiotics: Secondary | ICD-10-CM | POA: Insufficient documentation

## 2013-02-25 DIAGNOSIS — R110 Nausea: Secondary | ICD-10-CM | POA: Insufficient documentation

## 2013-02-25 DIAGNOSIS — F17200 Nicotine dependence, unspecified, uncomplicated: Secondary | ICD-10-CM | POA: Insufficient documentation

## 2013-02-25 MED ORDER — NAPROXEN 500 MG PO TABS: 500.0000 mg | ORAL_TABLET | Freq: Two times a day (BID) | ORAL | Status: DC

## 2013-02-25 MED ORDER — HYDROCODONE-ACETAMINOPHEN 5-325 MG PO TABS: 2.0000 | ORAL_TABLET | ORAL | Status: DC | PRN

## 2013-02-25 MED ORDER — BUPIVACAINE HCL (PF) 0.25 % IJ SOLN
INTRAMUSCULAR | Status: AC
  Administered 2013-02-25: 02:00:00
  Filled 2013-02-25: qty 30

## 2013-02-25 MED ORDER — LIDOCAINE HCL (PF) 2 % IJ SOLN
INTRAMUSCULAR | Status: AC
  Administered 2013-02-25: 02:00:00
  Filled 2013-02-25: qty 10

## 2013-02-25 MED ORDER — HYDROMORPHONE HCL PF 2 MG/ML IJ SOLN
2.0000 mg | Freq: Once | INTRAMUSCULAR | Status: AC
  Administered 2013-02-25: 2 mg via INTRAMUSCULAR
  Filled 2013-02-25: qty 1

## 2013-02-25 MED ORDER — OXYCODONE-ACETAMINOPHEN 5-325 MG PO TABS
2.0000 | ORAL_TABLET | Freq: Once | ORAL | Status: AC
  Administered 2013-02-25: 2 via ORAL
  Filled 2013-02-25: qty 2

## 2013-02-25 MED ORDER — KETOROLAC TROMETHAMINE 60 MG/2ML IM SOLN
60.0000 mg | Freq: Once | INTRAMUSCULAR | Status: AC
  Administered 2013-02-25: 60 mg via INTRAMUSCULAR
  Filled 2013-02-25: qty 2

## 2013-02-25 NOTE — ED Provider Notes (Signed)
CSN: [id number]     Arrival date & time 02/25/13  0106 History   First MD Initiated Contact with Patient 02/25/13 0120     Chief Complaint  Patient presents with  . Dental Pain  . Headache   (Consider location/radiation/quality/duration/timing/severity/associated sxs/prior Treatment) HPI Comments: Pt is a 32 y/o female with hx of dental pain - was seen yesterday for this and Rx tramadol and amox - she has been taking the medicine but has had increased pain this evening that has given her a secondary headache - states that she has hx of chronic pain and chronic migraine headaches - she states that the pain from her tooth is radiating up into her head on the L.  The affected tooth is the canine on the bottom left jaw - she feels as though she has a knot in this area.  Sx are persistent, gradually worsening, not associated with n/v/f.  She does endorse photophobia with her headache.  Patient is a 32 y.o. female presenting with tooth pain and headaches. The history is provided by the patient.  Dental Pain Associated symptoms: facial swelling and headaches   Associated symptoms: no fever and no neck pain   Headache Associated symptoms: no cough, no fever, no nausea, no neck pain and no vomiting     Past Medical History  Diagnosis Date  . Migraine   . Chronic pain    Past Surgical History  Procedure Laterality Date  . Head & neck wound repair / closure    . Cervical spine surgery     No family history on file. History  Substance Use Topics  . Smoking status: Current Every Day Smoker -- 1.00 packs/day for 19 years    Types: Cigarettes  . Smokeless tobacco: Never Used  . Alcohol Use: Yes     Comment: occas   OB History   Grav Para Term Preterm Abortions TAB SAB Ect Mult Living   4 4 4             Review of Systems  Constitutional: Negative for fever.  HENT: Positive for facial swelling and dental problem. Negative for neck pain.   Respiratory: Negative for cough and shortness of  breath.   Cardiovascular: Negative for chest pain.  Gastrointestinal: Negative for nausea and vomiting.  Skin: Negative for rash.  Neurological: Positive for headaches.    Allergies  Tylenol  Home Medications   Current Outpatient Rx  Name  Route  Sig  Dispense  Refill  . amoxicillin (AMOXIL) 500 MG capsule   Oral   Take 1 capsule (500 mg total) by mouth 3 (three) times daily.   21 capsule   0   . ibuprofen (ADVIL,MOTRIN) 200 MG tablet   Oral   Take 800 mg by mouth every 8 (eight) hours as needed for pain.         . traMADol (ULTRAM) 50 MG tablet   Oral   Take 1 tablet (50 mg total) by mouth every 6 (six) hours as needed for pain.   15 tablet   0   . HYDROcodone-acetaminophen (NORCO/VICODIN) 5-325 MG per tablet   Oral   Take 2 tablets by mouth every 4 (four) hours as needed for pain.   10 tablet   0   . naproxen (NAPROSYN) 500 MG tablet   Oral   Take 1 tablet (500 mg total) by mouth 2 (two) times daily with a meal.   30 tablet   0    BP 133/89  Pulse 70  Temp(Src) 98.4 F (36.9 C) (Oral)  Resp 18  Ht 5\' 1"  (1.549 m)  Wt 200 lb (90.719 kg)  BMI 37.81 kg/m2  SpO2 100%  LMP 02/09/2013 Physical Exam  Nursing note and vitals reviewed. Constitutional: She appears well-developed and well-nourished.  Uncomfortable appearing.  HENT:  Head: Normocephalic and atraumatic.  Mouth/Throat: Oropharynx is clear and moist. No oropharyngeal exudate.  There is moderate ttp at the base of the L lower canine - there is no significant fractures or injuries to the tooth, it is ttp but no subluxation.  She has no palpable abscess or abnormality of the jaw / mandible or gums and no ttp under the tongue - no jaw asymetry.  No trismus or torticollis  Eyes: Conjunctivae are normal. Right eye exhibits no discharge. Left eye exhibits no discharge. No scleral icterus.  Neck: Normal range of motion. Neck supple. No JVD present.  Cardiovascular: Normal rate, regular rhythm and normal  heart sounds.   No murmur heard. Pulmonary/Chest: Effort normal and breath sounds normal. No stridor.  Musculoskeletal: Normal range of motion. She exhibits no edema and no tenderness.  Lymphadenopathy:    She has no cervical adenopathy.  Neurological: She is alert. No cranial nerve deficit.  CN3-12 normal, speech clear, follows commands, uses all 4 extremities without difficulty  Skin: Skin is warm and dry. No rash noted. No erythema.    ED Course  NERVE BLOCK Date/Time: 02/25/2013 1:50 AM Performed by: Eber Hong D Authorized by: Eber Hong D Consent: Verbal consent obtained. Risks and benefits: risks, benefits and alternatives were discussed Consent given by: patient Patient identity confirmed: verbally with patient Time out: Immediately prior to procedure a "time out" was called to verify the correct patient, procedure, equipment, support staff and site/side marked as required. Indications: pain relief Body area: face/mouth Nerve: inferior alveolar Laterality: left Patient sedated: no Patient position: supine Needle gauge: 25 G Location technique: anatomical landmarks Local anesthetic: bupivacaine 0.25% without epinephrine and lidocaine 1% without epinephrine Anesthetic total: 6 ml Outcome: pain improved Patient tolerance: Patient tolerated the procedure well with no immediate complications.   (including critical care time) Labs Review Labs Reviewed - No data to display Imaging Review No results found.  MDM   1. Toothache   2. Headache    Pt has had significant improvement after dental block with inferior alveolar nerve block.  She has improvement in her headache after toradol and percocet - of note she has had percocet safely in the past and this is documented in her post partum notes from 2011.  She will f/u with dentistry, on call provider phone number given.  Meds given in ED:  Medications  lidocaine (XYLOCAINE) 2 % injection (not administered)   bupivacaine (PF) (MARCAINE) 0.25 % injection (not administered)  ketorolac (TORADOL) injection 60 mg (60 mg Intramuscular Given 02/25/13 0142)  oxyCODONE-acetaminophen (PERCOCET/ROXICET) 5-325 MG per tablet 2 tablet (2 tablets Oral Given 02/25/13 0143)    New Prescriptions   HYDROCODONE-ACETAMINOPHEN (NORCO/VICODIN) 5-325 MG PER TABLET    Take 2 tablets by mouth every 4 (four) hours as needed for pain.   NAPROXEN (NAPROSYN) 500 MG TABLET    Take 1 tablet (500 mg total) by mouth 2 (two) times daily with a meal.        Vida Roller, MD 02/25/13 [phone]

## 2013-02-25 NOTE — ED Notes (Signed)
Still awaiting ride. Informed by Registration that ride was here and then left again.

## 2013-02-25 NOTE — ED Notes (Signed)
Pt reports this being her 3rd visit since yesterday.  Pt reports being "unable to wait for the pharmacy to open" get her prescriptions filled today.  Pt reports " the hydrocodone ya'll gave me isn't gonna help me".

## 2013-02-25 NOTE — ED Notes (Signed)
MD at bedside. 

## 2013-02-25 NOTE — ED Notes (Signed)
Patient with no complaints at this time. Respirations even and unlabored. Skin warm/dry. Discharge instructions reviewed with patient at this time. Patient given opportunity to voice concerns/ask questions. Patient discharged at this time and left Emergency Department with steady gait, accompanied by Family member.

## 2013-02-25 NOTE — ED Provider Notes (Signed)
CSN: [id number]     Arrival date & time 02/25/13  4098 History  This chart was scribed for Shelda Jakes, MD by Bennett Scrape, ED Scribe. This patient was seen in room APA03/APA03 and the patient's care was started at 7:18 AM.   Chief Complaint  Patient presents with  . Dental Pain    Patient is a 32 y.o. female presenting with tooth pain. The history is provided by the patient. No language interpreter was used.  Dental Pain Location:  Lower Lower teeth location: left lower canine. Quality:  Throbbing Severity:  Severe Onset quality:  Gradual Duration:  3 days Timing:  Constant Chronicity:  Chronic Context: abscess (possible per pt)   Previous work-up:  Dental exam Ineffective treatments:  Dental block Associated symptoms: headaches   Associated symptoms: no congestion, no fever and no neck pain     HPI Comments: [name] is a 32 y.o. female with a h/o chronic dental pain who presents to the Emergency Department complaining of severe, lower left-sided dental pain that radiates up behind the eye and into her left head that she attributes to an abscess. She lists nausea and HA as associated symptoms. This is her 3rd visit since yesterday for the same. She was given Tramadol and Amoxicillin yesterday and states that she took the medicine as prescribed but experienced increased pain. She was then re-evaluated in the ED earlier this morning for the same dental pain and had a dental block procedure performed prior to discharge. She states that the two Percocet and the dental block only lasted for 2 hours and since then she has experienced gradually worsening, severe pain. She was prescribed Naprosyn and Norco earlier this morning but states that she is unable to wait for the pharmacy to open to get the prescriptions filled and has returned for continued pain control. She denies fevers and emesis as associated symptoms.  Pt denies having a PCP currently   Past Medical History   Diagnosis Date  . Migraine   . Chronic pain    Past Surgical History  Procedure Laterality Date  . Head & neck wound repair / closure    . Cervical spine surgery     No family history on file. History  Substance Use Topics  . Smoking status: Current Every Day Smoker -- 1.00 packs/day for 19 years    Types: Cigarettes  . Smokeless tobacco: Never Used  . Alcohol Use: Yes     Comment: occas   OB History   Grav Para Term Preterm Abortions TAB SAB Ect Mult Living   4 4 4             Review of Systems  Constitutional: Negative for fever and chills.  HENT: Positive for dental problem. Negative for congestion, sore throat and neck pain.   Eyes: Negative for visual disturbance.  Respiratory: Negative for cough and shortness of breath.   Cardiovascular: Negative for chest pain and leg swelling.  Gastrointestinal: Positive for nausea. Negative for vomiting, abdominal pain and diarrhea.  Genitourinary: Negative for dysuria.  Musculoskeletal: Negative for back pain.  Skin: Negative for rash.  Neurological: Positive for headaches.  Hematological: Does not bruise/bleed easily.  Psychiatric/Behavioral: Negative for confusion.    Allergies  Tylenol  Home Medications   Current Outpatient Rx  Name  Route  Sig  Dispense  Refill  . amoxicillin (AMOXIL) 500 MG capsule   Oral   Take 1 capsule (500 mg total) by mouth 3 (three) times daily.  21 capsule   0   . HYDROcodone-acetaminophen (NORCO/VICODIN) 5-325 MG per tablet   Oral   Take 2 tablets by mouth every 4 (four) hours as needed for pain.   10 tablet   0   . ibuprofen (ADVIL,MOTRIN) 200 MG tablet   Oral   Take 800 mg by mouth every 8 (eight) hours as needed for pain.         . naproxen (NAPROSYN) 500 MG tablet   Oral   Take 1 tablet (500 mg total) by mouth 2 (two) times daily with a meal.   30 tablet   0   . traMADol (ULTRAM) 50 MG tablet   Oral   Take 1 tablet (50 mg total) by mouth every 6 (six) hours as needed  for pain.   15 tablet   0    Triage Vitals: BP 132/63  Pulse 64  Temp(Src) 97.9 F (36.6 C) (Oral)  Resp 19  SpO2 100%  LMP 02/09/2013  Physical Exam  Nursing note and vitals reviewed. Constitutional: She is oriented to person, place, and time. She appears well-developed and well-nourished. No distress.  HENT:  Head: Normocephalic and atraumatic.  Mouth/Throat: Oropharynx is clear and moist.  Second to last molar is heavily decayed, left lower canine has a cary on the backside  Eyes: Conjunctivae and EOM are normal. Pupils are equal, round, and reactive to light.  Sclera are clear  Neck: Neck supple. No tracheal deviation present.  Cardiovascular: Normal rate and regular rhythm.   No murmur heard. Pulses:      Dorsalis pedis pulses are 2+ on the right side, and 2+ on the left side.  Pulmonary/Chest: Effort normal and breath sounds normal. No respiratory distress. She has no wheezes.  Abdominal: Soft. Bowel sounds are normal. She exhibits no distension. There is no tenderness.  Musculoskeletal: Normal range of motion. She exhibits no edema (no ankle swelling).  Lymphadenopathy:    She has no cervical adenopathy.  Neurological: She is alert and oriented to person, place, and time. No cranial nerve deficit.  Pt able to move both sets of fingers and toes  Skin: Skin is warm and dry. No rash noted.  Psychiatric: She has a normal mood and affect. Her behavior is normal.    ED Course  Procedures (including critical care time)  DIAGNOSTIC STUDIES: Oxygen Saturation is 100% on room air, normal by my interpretation.    COORDINATION OF CARE: 7:23 AM-Had long discussion with pt about prescriptions given and dental block procedure. Informed pt that she needs to fill the naprosyn and continue to take the antibiotic as prescribed. Discussed treatment plan which includes filling the prescription with pt at bedside and pt agreed to plan. Pt is requesting a dental block and I declined  stating that she already received that procedure earlier this morning.   Labs Review Labs Reviewed - No data to display Imaging Review No results found.  MDM   1. Pain due to dental caries    Patient's third visit for the same pain. Patient has been given adequate medications to include antibiotics anti-inflammatories and narcotic pain medicine. Patient unable to get the pain medication filled the during the night even though she was given 2 Percocet after midnight. Patient prescription filled we'll give a shot of IM dye lauded here recommend dental followup. Previous visit.charts were reviewed.     I personally performed the services described in this documentation, which was scribed in my presence. The recorded information has been  reviewed and is accurate.     Shelda Jakes, MD 02/25/13 [phone]

## 2013-02-25 NOTE — ED Notes (Signed)
Patient states was seen yesterday for dental abscess; c/o increased pain and pain in her head and ears now.

## 2013-02-25 NOTE — ED Notes (Signed)
Patient states she was "dropped off" by family. Informed by RN that she would have to have a ride present prior to discharge. Patient gave verbal understanding.

## 2013-02-26 NOTE — ED Provider Notes (Signed)
Medical screening examination/treatment/procedure(s) were performed by non-physician practitioner and as supervising physician I was immediately available for consultation/collaboration.   Glynn Octave, MD 02/26/13 [phone]

## 2013-06-13 ENCOUNTER — Encounter (HOSPITAL_COMMUNITY): Payer: Self-pay | Admitting: Emergency Medicine

## 2013-06-13 ENCOUNTER — Emergency Department (HOSPITAL_COMMUNITY)
Admission: EM | Admit: 2013-06-13 | Discharge: 2013-06-13 | Disposition: A | Discharge status: Home / Self Care | Payer: Self-pay | Attending: Emergency Medicine | Admitting: Emergency Medicine

## 2013-06-13 DIAGNOSIS — G8929 Other chronic pain: Secondary | ICD-10-CM | POA: Insufficient documentation

## 2013-06-13 DIAGNOSIS — R5381 Other malaise: Secondary | ICD-10-CM | POA: Insufficient documentation

## 2013-06-13 DIAGNOSIS — R109 Unspecified abdominal pain: Secondary | ICD-10-CM | POA: Insufficient documentation

## 2013-06-13 DIAGNOSIS — Z792 Long term (current) use of antibiotics: Secondary | ICD-10-CM | POA: Insufficient documentation

## 2013-06-13 DIAGNOSIS — M549 Dorsalgia, unspecified: Secondary | ICD-10-CM | POA: Insufficient documentation

## 2013-06-13 DIAGNOSIS — Z791 Long term (current) use of non-steroidal anti-inflammatories (NSAID): Secondary | ICD-10-CM | POA: Insufficient documentation

## 2013-06-13 DIAGNOSIS — R509 Fever, unspecified: Secondary | ICD-10-CM | POA: Insufficient documentation

## 2013-06-13 DIAGNOSIS — R5383 Other fatigue: Secondary | ICD-10-CM

## 2013-06-13 DIAGNOSIS — Z8679 Personal history of other diseases of the circulatory system: Secondary | ICD-10-CM | POA: Insufficient documentation

## 2013-06-13 DIAGNOSIS — IMO0001 Reserved for inherently not codable concepts without codable children: Secondary | ICD-10-CM | POA: Insufficient documentation

## 2013-06-13 DIAGNOSIS — Z3202 Encounter for pregnancy test, result negative: Secondary | ICD-10-CM | POA: Insufficient documentation

## 2013-06-13 DIAGNOSIS — F17200 Nicotine dependence, unspecified, uncomplicated: Secondary | ICD-10-CM | POA: Insufficient documentation

## 2013-06-13 DIAGNOSIS — R3989 Other symptoms and signs involving the genitourinary system: Secondary | ICD-10-CM | POA: Insufficient documentation

## 2013-06-13 DIAGNOSIS — M79609 Pain in unspecified limb: Secondary | ICD-10-CM | POA: Insufficient documentation

## 2013-06-13 LAB — URINE MICROSCOPIC-ADD ON

## 2013-06-13 LAB — URINALYSIS, ROUTINE W REFLEX MICROSCOPIC
Bilirubin Urine: NEGATIVE
Glucose, UA: NEGATIVE mg/dL
Ketones, ur: NEGATIVE mg/dL
Leukocytes, UA: NEGATIVE
Nitrite: NEGATIVE
Specific Gravity, Urine: >1.03 — ABNORMAL HIGH (ref 1.005–1.030)
Urobilinogen, UA: 0.2 mg/dL (ref 0.0–1.0)
pH: 6 (ref 5.0–8.0)

## 2013-06-13 LAB — POCT I-STAT, CHEM 8
BUN: 8 mg/dL (ref 6–23)
Calcium, Ion: 1.18 mmol/L (ref 1.12–1.23)
Chloride: 100 mEq/L (ref 96–112)
Creatinine, Ser: 0.8 mg/dL (ref 0.50–1.10)
Glucose, Bld: 105 mg/dL — ABNORMAL HIGH (ref 70–99)
HCT: 39 % (ref 36.0–46.0)
Hemoglobin: 13.3 g/dL (ref 12.0–15.0)
Potassium: 3.7 mEq/L (ref 3.7–5.3)
Sodium: 139 mEq/L (ref 137–147)
TCO2: 24 mmol/L (ref 0–100)

## 2013-06-13 LAB — POCT PREGNANCY, URINE: Preg Test, Ur: NEGATIVE

## 2013-06-13 MED ORDER — OXYCODONE-ACETAMINOPHEN 5-325 MG PO TABS
1.0000 | ORAL_TABLET | Freq: Once | ORAL | Status: AC
  Administered 2013-06-13: 1 via ORAL
  Filled 2013-06-13: qty 1

## 2013-06-13 NOTE — ED Notes (Signed)
Pt c/o intermittent bilateral flank pain x 1 year-worse over the past week with foul smelling urine today.

## 2013-06-13 NOTE — ED Provider Notes (Signed)
CSN: [id number]     Arrival date & time 06/13/13  0730 History  This chart was scribed for Katelyn Hill W Jeimy Bickert, MD by Quintella ReichertMatthew Underwood, ED scribe.  This patient was seen in room APA19/APA19 and the patient's care was started at 7:43 AM.   Chief Complaint  Patient presents with  . Flank Pain    Patient is a [age] y.o. female presenting with flank pain. The history is provided by the patient. No language interpreter was used.  Flank Pain This is a new problem. Episode onset: over a year. Episode frequency: Intermittent. The problem has been gradually worsening. Associated symptoms include abdominal pain. Pertinent negatives include no chest pain, no headaches and no shortness of breath. Associated symptoms comments: Leg pain, foul-smelling urine. She has tried nothing for the symptoms.    HPI Comments: [place] is a 33 y.o. female who presents to the Emergency Department complaining of bilateral flank pain with associated back pain, leg pain, and foul-smelling urine.  Pt states that her flank pain has been occurring off-and-on for over a year.  This morning she awoke with severe back pain and bilateral leg pain.  She states she does have chronic back pain but she feels it may have been worsening recently.  She states her back pain is worsened by standing up.  Pain does not radiate but rather she states her leg pain feels as if it is "in my bones."  Pt has also had "terrible-smelling" urine since this morning.  She also reports that she has felt very fatigued over the past week.  She denies fever, vomiting, diarrhea, CP, dysuria, bowel or bladder incontinence, focal weakness, asymmetrical leg swelling, or vaginal bleeding or discharge.     Past Medical History  Diagnosis Date  . Migraine   . Chronic pain     Past Surgical History  Procedure Laterality Date  . Head & neck wound repair / closure    . Cervical spine surgery      No family history on file.   History  Substance Use Topics   . Smoking status: Current Every Day Smoker -- 1.00 packs/day for 19 years    Types: Cigarettes  . Smokeless tobacco: Never Used  . Alcohol Use: Yes     Comment: occas    OB History   Grav Para Term Preterm Abortions TAB SAB Ect Mult Living   4 4 4              Review of Systems  Constitutional: Positive for fever.  Respiratory: Negative for shortness of breath.   Cardiovascular: Negative for chest pain.  Gastrointestinal: Positive for abdominal pain.  Genitourinary: Positive for flank pain.  Musculoskeletal: Positive for back pain and myalgias.  Neurological: Negative for headaches.  All other systems reviewed and are negative.     Allergies  Tylenol  Home Medications   Current Outpatient Rx  Name  Route  Sig  Dispense  Refill  . amoxicillin (AMOXIL) 500 MG capsule   Oral   Take 1 capsule (500 mg total) by mouth 3 (three) times daily.   21 capsule   0   . HYDROcodone-acetaminophen (NORCO/VICODIN) 5-325 MG per tablet   Oral   Take 2 tablets by mouth every 4 (four) hours as needed for pain.   10 tablet   0   . ibuprofen (ADVIL,MOTRIN) 200 MG tablet   Oral   Take 800 mg by mouth every 8 (eight) hours as needed for pain.         .Marland Kitchen  naproxen (NAPROSYN) 500 MG tablet   Oral   Take 1 tablet (500 mg total) by mouth 2 (two) times daily with a meal.   30 tablet   0   . traMADol (ULTRAM) 50 MG tablet   Oral   Take 1 tablet (50 mg total) by mouth every 6 (six) hours as needed for pain.   15 tablet   0    Pulse 110  Temp(Src) 99 F (37.2 C)  Resp 18  Ht 5\' 1"  (1.549 m)  Wt 200 lb (90.719 kg)  BMI 37.81 kg/m2  SpO2 100%   Physical Exam CONSTITUTIONAL: Well developed/well nourished HEAD: Normocephalic/atraumatic EYES: EOMI/PERRL ENMT: Mucous membranes moist NECK: supple no meningeal signs SPINE:entire spine nontender CV: S1/S2 noted, no murmurs/rubs/gallops noted LUNGS: Lungs are clear to auscultation bilaterally, no apparent distress ABDOMEN: soft,  nontender, no rebound or guarding JX:BJYN bilateral flank tenderness, no swelling or erythema NEURO: Awake/alert, equal distal motor: hip flexion/knee flexion/extension, ankle dorsi/plantar flexion, great toe extension intact bilaterally, no clonus bilaterally, plantar reflex appropriate, no apparent sensory deficit in any dermatome.  Equal patellar/achilles reflex noted.  Pt is able to ambulate. EXTREMITIES: pulses normal, full ROM SKIN: warm, color normal PSYCH: no abnormalities of mood noted     ED Course  Procedures (including critical care time)  DIAGNOSTIC STUDIES: Oxygen Saturation is 100% on room air, normal by my interpretation.    COORDINATION OF CARE: 7:51 AM-Discussed treatment plan which includes pain medication and labs with pt at bedside and pt agreed to plan.  8:43 AM Pt well appearing, talking on phone in no distress, ambulatory BP 102/49  Pulse 88  Temp(Src) 98.6 F (37 C) (Oral)  Resp 20  Ht 5\' 1"  (1.549 m)  Wt 200 lb (90.719 kg)  BMI 37.81 kg/m2  SpO2 100%  LMP 06/02/2013 No signs of uti Electrolytes reassuring She does have hematuria with h/o kidney stones, but at this point is well appearing and in no distress Discussed need for close f/u    Labs Review Labs Reviewed  URINALYSIS, ROUTINE W REFLEX MICROSCOPIC - Abnormal; Notable for the following:    Specific Gravity, Urine >1.030 (*)    Hgb urine dipstick MODERATE (*)    Protein, ur TRACE (*)    All other components within normal limits  URINE MICROSCOPIC-ADD ON - Abnormal; Notable for the following:    Squamous Epithelial / LPF MANY (*)    Bacteria, UA MANY (*)    All other components within normal limits  POCT I-STAT, CHEM 8 - Abnormal; Notable for the following:    Glucose, Bld 105 (*)    All other components within normal limits  POCT PREGNANCY, URINE    Imaging Review No results found.   EKG Interpretation   None       MDM  No diagnosis found. Nursing notes including past  medical history and social history reviewed and considered in documentation Labs/vital reviewed and considered     I personally performed the services described in this documentation, which was scribed in my presence. The recorded information has been reviewed and is accurate.      Katelyn Gaskins, MD 06/13/13 [phone]

## 2013-06-13 NOTE — ED Notes (Signed)
Pt stated she has known low blood pressure, was at [facility] and "rushed straight back when she had a migraine because her bp was low"

## 2013-09-13 ENCOUNTER — Encounter (HOSPITAL_COMMUNITY): Payer: Self-pay | Admitting: Emergency Medicine

## 2013-09-13 ENCOUNTER — Emergency Department (HOSPITAL_COMMUNITY)
Admission: EM | Admit: 2013-09-13 | Discharge: 2013-09-13 | Disposition: A | Discharge status: Home / Self Care | Payer: Self-pay | Attending: Emergency Medicine | Admitting: Emergency Medicine

## 2013-09-13 DIAGNOSIS — N390 Urinary tract infection, site not specified: Secondary | ICD-10-CM | POA: Insufficient documentation

## 2013-09-13 DIAGNOSIS — Z3202 Encounter for pregnancy test, result negative: Secondary | ICD-10-CM | POA: Insufficient documentation

## 2013-09-13 DIAGNOSIS — F17200 Nicotine dependence, unspecified, uncomplicated: Secondary | ICD-10-CM | POA: Insufficient documentation

## 2013-09-13 DIAGNOSIS — K2970 Gastritis, unspecified, without bleeding: Secondary | ICD-10-CM | POA: Insufficient documentation

## 2013-09-13 DIAGNOSIS — K2990 Gastroduodenitis, unspecified, without bleeding: Principal | ICD-10-CM

## 2013-09-13 DIAGNOSIS — Z8679 Personal history of other diseases of the circulatory system: Secondary | ICD-10-CM | POA: Insufficient documentation

## 2013-09-13 DIAGNOSIS — G8929 Other chronic pain: Secondary | ICD-10-CM | POA: Insufficient documentation

## 2013-09-13 LAB — CBC WITH DIFFERENTIAL/PLATELET
Basophils Absolute: 0 10*3/uL (ref 0.0–0.1)
Basophils Relative: 1 % (ref 0–1)
Eosinophils Absolute: 0.1 10*3/uL (ref 0.0–0.7)
Eosinophils Relative: 1 % (ref 0–5)
HCT: 33.6 % — ABNORMAL LOW (ref 36.0–46.0)
Hemoglobin: 10.1 g/dL — ABNORMAL LOW (ref 12.0–15.0)
Lymphocytes Relative: 23 % (ref 12–46)
Lymphs Abs: 2 10*3/uL (ref 0.7–4.0)
MCH: 20.7 pg — ABNORMAL LOW (ref 26.0–34.0)
MCHC: 30.1 g/dL (ref 30.0–36.0)
MCV: 69 fL — ABNORMAL LOW (ref 78.0–100.0)
Monocytes Absolute: 0.6 10*3/uL (ref 0.1–1.0)
Monocytes Relative: 7 % (ref 3–12)
Neutro Abs: 5.9 10*3/uL (ref 1.7–7.7)
Neutrophils Relative %: 68 % (ref 43–77)
Platelets: 271 10*3/uL (ref 150–400)
RBC: 4.87 MIL/uL (ref 3.87–5.11)
RDW: 18.6 % — ABNORMAL HIGH (ref 11.5–15.5)
WBC: 8.8 10*3/uL (ref 4.0–10.5)

## 2013-09-13 LAB — COMPREHENSIVE METABOLIC PANEL
ALT: 18 U/L (ref 0–35)
AST: 20 U/L (ref 0–37)
Albumin: 3.6 g/dL (ref 3.5–5.2)
Alkaline Phosphatase: 76 U/L (ref 39–117)
BUN: 9 mg/dL (ref 6–23)
CO2: 28 mEq/L (ref 19–32)
Calcium: 9.4 mg/dL (ref 8.4–10.5)
Chloride: 100 mEq/L (ref 96–112)
Creatinine, Ser: 0.63 mg/dL (ref 0.50–1.10)
GFR calc Af Amer: >90 mL/min (ref >90)
GFR calc non Af Amer: >90 mL/min (ref >90)
Glucose, Bld: 99 mg/dL (ref 70–99)
Potassium: 3.7 mEq/L (ref 3.7–5.3)
Sodium: 139 mEq/L (ref 137–147)
Total Bilirubin: 0.2 mg/dL — ABNORMAL LOW (ref 0.3–1.2)
Total Protein: 7.1 g/dL (ref 6.0–8.3)

## 2013-09-13 LAB — URINALYSIS, ROUTINE W REFLEX MICROSCOPIC
Bilirubin Urine: NEGATIVE
Glucose, UA: NEGATIVE mg/dL
Ketones, ur: NEGATIVE mg/dL
Leukocytes, UA: NEGATIVE
Nitrite: NEGATIVE
Protein, ur: NEGATIVE mg/dL
Specific Gravity, Urine: 1.015 (ref 1.005–1.030)
Urobilinogen, UA: 0.2 mg/dL (ref 0.0–1.0)
pH: 7.5 (ref 5.0–8.0)

## 2013-09-13 LAB — LIPASE, BLOOD: Lipase: 36 U/L (ref 11–59)

## 2013-09-13 LAB — URINE MICROSCOPIC-ADD ON

## 2013-09-13 LAB — PREGNANCY, URINE: Preg Test, Ur: NEGATIVE

## 2013-09-13 MED ORDER — PROMETHAZINE HCL 25 MG PO TABS: 25.0000 mg | ORAL_TABLET | Freq: Four times a day (QID) | ORAL | Status: DC | PRN

## 2013-09-13 MED ORDER — PANTOPRAZOLE SODIUM 40 MG IV SOLR
40.0000 mg | Freq: Once | INTRAVENOUS | Status: AC
  Administered 2013-09-13: 40 mg via INTRAVENOUS
  Filled 2013-09-13: qty 40

## 2013-09-13 MED ORDER — CEPHALEXIN 500 MG PO CAPS: 500.0000 mg | ORAL_CAPSULE | Freq: Three times a day (TID) | ORAL | Status: DC

## 2013-09-13 MED ORDER — ONDANSETRON HCL 4 MG/2ML IJ SOLN
4.0000 mg | Freq: Once | INTRAMUSCULAR | Status: AC
  Administered 2013-09-13: 4 mg via INTRAVENOUS
  Filled 2013-09-13: qty 2

## 2013-09-13 MED ORDER — RANITIDINE HCL 150 MG PO CAPS: 150.0000 mg | ORAL_CAPSULE | Freq: Every day | ORAL | Status: DC

## 2013-09-13 MED ORDER — HYDROMORPHONE HCL PF 1 MG/ML IJ SOLN
1.0000 mg | Freq: Once | INTRAMUSCULAR | Status: AC
  Administered 2013-09-13: 1 mg via INTRAVENOUS
  Filled 2013-09-13: qty 1

## 2013-09-13 NOTE — ED Provider Notes (Signed)
CSN: [id number]     Arrival date & time 09/13/13  2008 History  This chart was scribed for Benny LennertJoseph L Bianey Tesoro, MD by Ardelia Memsylan Malpass, ED Scribe. This patient was seen in room APA11/APA11 and the patient's care was started at 8:15 PM.  Chief Complaint  Patient presents with  . Abdominal Pain    Patient is a 33 y.o. female presenting with abdominal pain. The history is provided by the patient. No language interpreter was used.  Abdominal Pain Pain location:  Generalized Pain quality: burning and cramping   Pain quality comment:  "feels like contractions" Pain radiates to:  Does not radiate Pain severity:  Moderate Onset quality:  Gradual Duration:  1 week Timing:  Intermittent Progression:  Worsening Chronicity:  New Relieved by:  Nothing Worsened by:  Nothing tried Ineffective treatments:  None tried Associated symptoms: nausea   Associated symptoms: no chest pain, no cough, no diarrhea, no fatigue, no hematuria and no vomiting     HPI Comments: Katelyn Hill is a 33 y.o. female with a history of chronic pain who presents to the Emergency Department complaining of intermittent "burning, cramping" generalized abdominal pain over the past week. She further describes her pain as feeling "like contractions". She reports associated nausea, but denies any emesis. She states that she has a history of 4 C-sections, but no other surgical abdominal history. She states that she had similar pain a few months ago, which resolved.   Past Medical History  Diagnosis Date  . Migraine   . Chronic pain    Past Surgical History  Procedure Laterality Date  . Head & neck wound repair / closure    . Cervical spine surgery     No family history on file. History  Substance Use Topics  . Smoking status: Current Every Day Smoker -- 1.00 packs/day for 19 years    Types: Cigarettes  . Smokeless tobacco: Never Used  . Alcohol Use: Yes     Comment: occas   OB History   Grav Para Term Preterm  Abortions TAB SAB Ect Mult Living   4 4 4             Review of Systems  Constitutional: Negative for appetite change and fatigue.  HENT: Negative for congestion, ear discharge and sinus pressure.   Eyes: Negative for discharge.  Respiratory: Negative for cough.   Cardiovascular: Negative for chest pain.  Gastrointestinal: Positive for nausea and abdominal pain. Negative for vomiting and diarrhea.  Genitourinary: Negative for frequency and hematuria.  Musculoskeletal: Negative for back pain.  Skin: Negative for rash.  Neurological: Negative for seizures and headaches.  Psychiatric/Behavioral: Negative for hallucinations.    Allergies  Tylenol  Home Medications   Prior to Admission medications   Medication Sig Start Date End Date Taking? Authorizing Provider  ibuprofen (ADVIL,MOTRIN) 200 MG tablet Take 800 mg by mouth every 8 (eight) hours as needed for pain.    Historical Provider, MD   Triage Vitals: BP 116/58  Pulse 86  Temp(Src) 98.5 F (36.9 C) (Oral)  Resp 22  Ht 5\' 1"  (1.549 m)  Wt 190 lb (86.183 kg)  BMI 35.92 kg/m2  SpO2 100%  LMP 08/25/2013  Physical Exam  Constitutional: She is oriented to person, place, and time. She appears well-developed.  HENT:  Head: Normocephalic.  Eyes: Conjunctivae and EOM are normal. No scleral icterus.  Neck: Neck supple. No thyromegaly present.  Cardiovascular: Normal rate and regular rhythm.  Exam reveals no gallop and no  friction rub.   No murmur heard. Pulmonary/Chest: No stridor. She has no wheezes. She has no rales. She exhibits no tenderness.  Abdominal: She exhibits no distension. There is tenderness. There is no rebound.  Moderate epigastric and RUQ tenderness  Musculoskeletal: Normal range of motion. She exhibits no edema.  Lymphadenopathy:    She has no cervical adenopathy.  Neurological: She is oriented to person, place, and time. She exhibits normal muscle tone. Coordination normal.  Skin: No rash noted. No  erythema.  Psychiatric: She has a normal mood and affect. Her behavior is normal.    ED Course  Procedures (including critical care time)  DIAGNOSTIC STUDIES: Oxygen Saturation is 100% on RA, normal by my interpretation.    COORDINATION OF CARE: 8:20 PM- Discussed plan to obtain diagnostic lab work. Will also order medications.  Pt advised of plan for treatment and pt agrees.  Medications  pantoprazole (PROTONIX) injection 40 mg (not administered)  ondansetron (ZOFRAN) injection 4 mg (not administered)  HYDROmorphone (DILAUDID) injection 1 mg (not administered)   Labs Review Labs Reviewed - No data to display  Imaging Review No results found.   EKG Interpretation None      MDM   Final diagnoses:  None    Gastritis,  Uti,   Pt to follow up with pcp The chart was scribed for me under my direct supervision.  I personally performed the history, physical, and medical decision making and all procedures in the evaluation of this patient.Benny Lennert.   Pennye Beeghly L Doraine Schexnider, MD 09/13/13 2133

## 2013-09-13 NOTE — ED Notes (Signed)
Patient states that she has been having the epigastric pain for the past year.

## 2013-09-13 NOTE — ED Notes (Signed)
Patient given discharge instruction, verbalized understand. IV removed, band aid applied. Patient ambulatory out of the department.  

## 2013-09-13 NOTE — Discharge Instructions (Signed)
Follow up with your family md next week.  Return if problems °

## 2013-09-13 NOTE — ED Notes (Signed)
For a whole week, I have been feeling like I am having contractions per pt. Patient states that it feels like it is tight in her chest. Patient points to the epigastric area. Patient states that she has been having her period off and on for 3 weeks.

## 2014-03-31 ENCOUNTER — Encounter (HOSPITAL_COMMUNITY): Payer: Self-pay | Admitting: Emergency Medicine

## 2014-08-19 ENCOUNTER — Other Ambulatory Visit (HOSPITAL_COMMUNITY)
Admission: RE | Admit: 2014-08-19 | Discharge: 2014-08-19 | Disposition: A | Discharge status: Home / Self Care | Payer: BLUE CROSS/BLUE SHIELD | Source: Ambulatory Visit | Attending: Obstetrics & Gynecology | Admitting: Obstetrics & Gynecology

## 2014-08-19 ENCOUNTER — Encounter: Payer: Self-pay | Admitting: Women's Health

## 2014-08-19 ENCOUNTER — Ambulatory Visit (INDEPENDENT_AMBULATORY_CARE_PROVIDER_SITE_OTHER): Payer: BLUE CROSS/BLUE SHIELD | Admitting: Women's Health

## 2014-08-19 VITALS — BP 122/72 | HR 87 | Ht 61.0 in | Wt 220.0 lb

## 2014-08-19 DIAGNOSIS — Z01419 Encounter for gynecological examination (general) (routine) without abnormal findings: Secondary | ICD-10-CM

## 2014-08-19 DIAGNOSIS — Z1151 Encounter for screening for human papillomavirus (HPV): Secondary | ICD-10-CM | POA: Diagnosis present

## 2014-08-19 DIAGNOSIS — R102 Pelvic and perineal pain: Secondary | ICD-10-CM | POA: Insufficient documentation

## 2014-08-19 DIAGNOSIS — F17200 Nicotine dependence, unspecified, uncomplicated: Secondary | ICD-10-CM | POA: Insufficient documentation

## 2014-08-19 DIAGNOSIS — Z720 Tobacco use: Secondary | ICD-10-CM | POA: Diagnosis not present

## 2014-08-19 DIAGNOSIS — Z1231 Encounter for screening mammogram for malignant neoplasm of breast: Secondary | ICD-10-CM

## 2014-08-19 DIAGNOSIS — Z803 Family history of malignant neoplasm of breast: Secondary | ICD-10-CM | POA: Insufficient documentation

## 2014-08-19 MED ORDER — VALACYCLOVIR HCL 1 G PO TABS: 1000.0000 mg | ORAL_TABLET | Freq: Two times a day (BID) | ORAL | Status: DC

## 2014-08-19 NOTE — Patient Instructions (Addendum)
Nothing to eat or drink after midnight the night before your next visit Call [facility] to get your screening mammogram scheduled [phone]) Whole 30 for weight loss Begin exercising slowly, walking/swimming daily Stop smoking

## 2014-08-19 NOTE — Progress Notes (Signed)
Patient ID: Katelyn Hill, female   DOB: Jun 07, 1980, 34 y.o.   MRN: [id number] Subjective:   Katelyn Hill is a 34 y.o. [id number] Caucasian female here for a routine well-woman exam.  Patient's last menstrual period was 08/05/2014.    Current complaints: h/o HSV2, feels like she gets outbreaks around her periods sometimes- wants rx to have on hand in case she needs it. Some pain in c/s incision at times when coughing. Wants weight loss meds. Wants to get healthier.  Just recently got insurance, has not had for awhile, so is behind on all screenings/exams. Periods q 3wks w/ clots, no increased flow.  PCP: None       Does desire labs, not fasting today Smoker: 1.5ppd, knows she needs to quit, but enjoys smoking  The following portions of the patient's history were reviewed and updated as appropriate: allergies, current medications, past family history, past medical history, past social history, past surgical history and problem list.  Past Medical History Past Medical History  Diagnosis Date  . Migraine   . Chronic pain     Past Surgical History Past Surgical History  Procedure Laterality Date  . Head & neck wound repair / closure    . Cervical spine surgery    . Cesarean section      Gynecologic History [id number]  Patient's last menstrual period was 08/05/2014. Contraception: tubal ligation Last Pap: unsure. Results were: thinks it was normal, does have h/o HPV, last one in epic is 2010 and was normal Last mammogram: never. Results were: n/a. Mom dx at 67yo & deceased at 55yo w/ breast CA Last TCS: never  Obstetric History OB History  Gravida Para Term Preterm AB SAB TAB Ectopic Multiple Living  # Outcome Date GA Lbr Len/2nd Weight Sex Delivery Anes PTL Lv  4 Term           3 Term           2 Term           1 Term               Current Medications Current Outpatient Prescriptions on File Prior to Visit  Medication Sig Dispense Refill  . calcium carbonate  (TUMS - DOSED IN MG ELEMENTAL CALCIUM) 500 MG chewable tablet Chew 1 tablet by mouth daily as needed for indigestion or heartburn.    . cephALEXin (KEFLEX) 500 MG capsule Take 1 capsule (500 mg total) by mouth 3 (three) times daily. (Patient not taking: Reported on 08/19/2014) 20 capsule 0  . promethazine (PHENERGAN) 25 MG tablet Take 1 tablet (25 mg total) by mouth every 6 (six) hours as needed for nausea or vomiting. (Patient not taking: Reported on 08/19/2014) 15 tablet 0  . ranitidine (ZANTAC) 150 MG capsule Take 1 capsule (150 mg total) by mouth daily. (Patient not taking: Reported on 08/19/2014) 30 capsule 0   No current facility-administered medications on file prior to visit.    Review of Systems Patient denies any headaches, blurred vision, shortness of breath, chest pain, abdominal pain, problems with bowel movements, urination, or intercourse.  Objective:  BP 122/72 mmHg  Pulse 87  Ht  (1.549 m)  Wt 220 lb (99.791 kg)  BMI 41.59 kg/m2  LMP 08/05/2014 Physical Exam  General:  Well developed, well nourished, no acute distress. She is alert and oriented x3. Skin:  Warm and dry Neck:  Midline trachea,  no thyromegaly or nodules Cardiovascular: Regular rate and rhythm, no murmur heard Lungs:  Effort normal, all lung fields clear to auscultation bilaterally Breasts:  No dominant palpable mass, retraction, or nipple discharge Abdomen:  Soft, no hepatosplenomegaly or masses. + LLQ tenderness to light & deep palpation Pelvic:  External genitalia is normal in appearance.  The vagina is normal in appearance. The cervix is bulbous, no CMT.  Thin prep pap is done w/ HR HPV cotesting. Uterus is felt to be normal size, shape, and contour.  No adnexal masses. + Diffuse tenderness Extremities:  No swelling or varicosities noted Psych:  She has a normal mood and affect  Assessment:   Healthy well-woman exam Pelvic pain Smoker Obese, BMI 41.59 Family h/o breast CA in mom @ 36yo, needs  screening mammo   Plan:  Smokes 1.5pp/day, advised cessation, discussed risks to herself. Offered [facility], accepted, referral sent.    F/U 1wk for fasting labs: CBC, CMP, TSH, Lipid panel, A1C, pelvic u/s, and f/u w/ me Call [facility] to schedule screening mammogram d/t cancer in mom @ 36yo Colonoscopy @ 50yo or sooner if problems Start exercising, walking/swimming, daily Healthy diet, decrease carbs, can try whole [address]  Marge DuncansBooker, Buddie Marston Randall CNM, [facility]NP-BC 08/19/2014 4:28 PM

## 2014-08-22 LAB — CYTOLOGY - PAP

## 2014-08-29 ENCOUNTER — Other Ambulatory Visit: Payer: BLUE CROSS/BLUE SHIELD

## 2015-03-02 ENCOUNTER — Ambulatory Visit: Payer: BLUE CROSS/BLUE SHIELD | Admitting: Adult Health

## 2015-03-12 ENCOUNTER — Encounter: Payer: Self-pay | Admitting: *Deleted

## 2015-03-12 ENCOUNTER — Ambulatory Visit: Payer: BLUE CROSS/BLUE SHIELD | Admitting: Adult Health

## 2015-06-14 ENCOUNTER — Emergency Department (HOSPITAL_COMMUNITY): Payer: BLUE CROSS/BLUE SHIELD

## 2015-06-14 ENCOUNTER — Emergency Department (HOSPITAL_COMMUNITY)
Admission: EM | Admit: 2015-06-14 | Discharge: 2015-06-14 | Disposition: A | Discharge status: Home / Self Care | Payer: BLUE CROSS/BLUE SHIELD | Attending: Emergency Medicine | Admitting: Emergency Medicine

## 2015-06-14 ENCOUNTER — Encounter (HOSPITAL_COMMUNITY): Payer: Self-pay | Admitting: Emergency Medicine

## 2015-06-14 DIAGNOSIS — K5900 Constipation, unspecified: Secondary | ICD-10-CM | POA: Diagnosis not present

## 2015-06-14 DIAGNOSIS — R1011 Right upper quadrant pain: Secondary | ICD-10-CM | POA: Diagnosis present

## 2015-06-14 DIAGNOSIS — R1013 Epigastric pain: Secondary | ICD-10-CM | POA: Diagnosis not present

## 2015-06-14 DIAGNOSIS — G8929 Other chronic pain: Secondary | ICD-10-CM | POA: Insufficient documentation

## 2015-06-14 DIAGNOSIS — R109 Unspecified abdominal pain: Secondary | ICD-10-CM

## 2015-06-14 DIAGNOSIS — F17210 Nicotine dependence, cigarettes, uncomplicated: Secondary | ICD-10-CM | POA: Diagnosis not present

## 2015-06-14 DIAGNOSIS — D649 Anemia, unspecified: Secondary | ICD-10-CM

## 2015-06-14 LAB — CBC WITH DIFFERENTIAL/PLATELET
Basophils Absolute: 0 10*3/uL (ref 0.0–0.1)
Basophils Relative: 0 %
Eosinophils Absolute: 0.1 10*3/uL (ref 0.0–0.7)
Eosinophils Relative: 1 %
HCT: 29.9 % — ABNORMAL LOW (ref 36.0–46.0)
Hemoglobin: 8.9 g/dL — ABNORMAL LOW (ref 12.0–15.0)
Lymphocytes Relative: 22 %
Lymphs Abs: 2.2 10*3/uL (ref 0.7–4.0)
MCH: 19.7 pg — ABNORMAL LOW (ref 26.0–34.0)
MCHC: 29.8 g/dL — ABNORMAL LOW (ref 30.0–36.0)
MCV: 66.2 fL — ABNORMAL LOW (ref 78.0–100.0)
Monocytes Absolute: 0.5 10*3/uL (ref 0.1–1.0)
Monocytes Relative: 5 %
Neutro Abs: 6.8 10*3/uL (ref 1.7–7.7)
Neutrophils Relative %: 71 %
Platelets: 242 10*3/uL (ref 150–400)
RBC: 4.52 MIL/uL (ref 3.87–5.11)
RDW: 18.8 % — ABNORMAL HIGH (ref 11.5–15.5)
WBC: 9.6 10*3/uL (ref 4.0–10.5)

## 2015-06-14 LAB — COMPREHENSIVE METABOLIC PANEL
ALT: 14 U/L (ref 14–54)
AST: 18 U/L (ref 15–41)
Albumin: 3.9 g/dL (ref 3.5–5.0)
Alkaline Phosphatase: 83 U/L (ref 38–126)
Anion gap: 6 (ref 5–15)
BUN: 11 mg/dL (ref 6–20)
CO2: 26 mmol/L (ref 22–32)
Calcium: 8.8 mg/dL — ABNORMAL LOW (ref 8.9–10.3)
Chloride: 104 mmol/L (ref 101–111)
Creatinine, Ser: 0.61 mg/dL (ref 0.44–1.00)
GFR calc Af Amer: >60 mL/min (ref >60)
GFR calc non Af Amer: >60 mL/min (ref >60)
Glucose, Bld: 111 mg/dL — ABNORMAL HIGH (ref 65–99)
Potassium: 3.6 mmol/L (ref 3.5–5.1)
Sodium: 136 mmol/L (ref 135–145)
Total Bilirubin: 0.5 mg/dL (ref 0.3–1.2)
Total Protein: 6.9 g/dL (ref 6.5–8.1)

## 2015-06-14 LAB — LIPASE, BLOOD: Lipase: 26 U/L (ref 11–51)

## 2015-06-14 MED ORDER — PROMETHAZINE HCL 12.5 MG PO TABS
25.0000 mg | ORAL_TABLET | Freq: Once | ORAL | Status: AC
  Administered 2015-06-14: 25 mg via ORAL
  Filled 2015-06-14: qty 2

## 2015-06-14 MED ORDER — IOHEXOL 300 MG/ML  SOLN
100.0000 mL | Freq: Once | INTRAMUSCULAR | Status: AC | PRN
  Administered 2015-06-14: 100 mL via INTRAVENOUS

## 2015-06-14 MED ORDER — OXYCODONE HCL 5 MG PO TABS
5.0000 mg | ORAL_TABLET | Freq: Once | ORAL | Status: AC
  Administered 2015-06-14: 5 mg via ORAL
  Filled 2015-06-14: qty 1

## 2015-06-14 MED ORDER — DIATRIZOATE MEGLUMINE & SODIUM 66-10 % PO SOLN
ORAL | Status: AC
  Administered 2015-06-14: 30 mL
  Filled 2015-06-14: qty 30

## 2015-06-14 NOTE — ED Notes (Signed)
PA Hobson Bryant at bedside. 

## 2015-06-14 NOTE — Discharge Instructions (Signed)
Your vital signs are well within normal limits. Your oxygen level is 100%. Your lab work shows that you have a mild anemia. Please increase foods high in iron. And use Slow Fe, or similar product. Your CT scan shows possible mild enlargement of the liver, and a few small stones in the kidney, but otherwise no acute changes or problems. Please see Dr. Carmon GinsbergF Anemia, Nonspecific Anemia is a condition in which the concentration of red blood cells or hemoglobin in the blood is below normal. Hemoglobin is a substance in red blood cells that carries oxygen to the tissues of the body. Anemia results in not enough oxygen reaching these tissues.  CAUSES  Common causes of anemia include:   Excessive bleeding. Bleeding may be internal or external. This includes excessive bleeding from periods (in women) or from the intestine.   Poor nutrition.   Chronic kidney, thyroid, and liver disease.  Bone marrow disorders that decrease red blood cell production.  Cancer and treatments for cancer.  HIV, AIDS, and their treatments.  Spleen problems that increase red blood cell destruction.  Blood disorders.  Excess destruction of red blood cells due to infection, medicines, and autoimmune disorders. SIGNS AND SYMPTOMS   Minor weakness.   Dizziness.   Headache.  Palpitations.   Shortness of breath, especially with exercise.   Paleness.  Cold sensitivity.  Indigestion.  Nausea.  Difficulty sleeping.  Difficulty concentrating. Symptoms may occur suddenly or they may develop slowly.  DIAGNOSIS  Additional blood tests are often needed. These help your health care provider determine the best treatment. Your health care provider will check your stool for blood and look for other causes of blood loss.  TREATMENT  Treatment varies depending on the cause of the anemia. Treatment can include:   Supplements of iron, vitamin B12, or folic acid.   Hormone medicines.   A blood transfusion. This  may be needed if blood loss is severe.   Hospitalization. This may be needed if there is significant continual blood loss.   Dietary changes.  Spleen removal. HOME CARE INSTRUCTIONS Keep all follow-up appointments. It often takes many weeks to correct anemia, and having your health care provider check on your condition and your response to treatment is very important. SEEK IMMEDIATE MEDICAL CARE IF:   You develop extreme weakness, shortness of breath, or chest pain.   You become dizzy or have trouble concentrating.  You develop heavy vaginal bleeding.   You develop a rash.   You have bloody or black, tarry stools.   You faint.   You vomit up blood.   You vomit repeatedly.   You have abdominal pain.  You have a fever or persistent symptoms for more than 2-3 days.   You have a fever and your symptoms suddenly get worse.   You are dehydrated.  MAKE SURE YOU:  Understand these instructions.  Will watch your condition.  Will get help right away if you are not doing well or get worse.   This information is not intended to replace advice given to you by your health care provider. Make sure you discuss any questions you have with your health care provider.   Document Released: 06/23/2004 Document Revised: 01/16/2013 Document Reviewed: 11/09/2012 Elsevier Interactive Patient Education 2016 Elsevier Inc.  Abdominal Pain, Adult Many things can cause belly (abdominal) pain. Most times, the belly pain is not dangerous. Many cases of belly pain can be watched and treated at home. HOME CARE   Do not take medicines  that help you go poop (laxatives) unless told to by your doctor.  Only take medicine as told by your doctor.  Eat or drink as told by your doctor. Your doctor will tell you if you should be on a special diet. GET HELP IF:  You do not know what is causing your belly pain.  You have belly pain while you are sick to your stomach (nauseous) or have  runny poop (diarrhea).  You have pain while you pee or poop.  Your belly pain wakes you up at night.  You have belly pain that gets worse or better when you eat.  You have belly pain that gets worse when you eat fatty foods.  You have a fever. GET HELP RIGHT AWAY IF:   The pain does not go away within 2 hours.  You keep throwing up (vomiting).  The pain changes and is only in the right or left part of the belly.  You have bloody or tarry looking poop. MAKE SURE YOU:   Understand these instructions.  Will watch your condition.  Will get help right away if you are not doing well or get worse.   This information is not intended to replace advice given to you by your health care provider. Make sure you discuss any questions you have with your health care provider.   Document Released: 11/02/2007 Document Revised: 06/06/2014 Document Reviewed: 01/23/2013 Elsevier Interactive Patient Education 2016 Elsevier Inc. ields (GI) for additional assessment of your abdominal pain and your anemia.

## 2015-06-14 NOTE — ED Provider Notes (Signed)
CSN: [id number]     Arrival date & time 06/14/15  1745 History  By signing my name below, I, Elon Spanner, attest that this documentation has been prepared under the direction and in the presence of Ivery Quale, PA-C. Electronically Signed: Elon Spanner ED Scribe. 06/14/2015. 6:06 PM.    Chief Complaint  Patient presents with  . Abdominal Pain   The history is provided by the patient. No language interpreter was used.    HPI Comments: [name] is a 35 y.o. female who presents to the Emergency Department complaining of intermittent RUQ abdominal pain onset 1 year ago that became constant last week.  She describes the complaint as "feeling swollen" and "like something is in there."  She also notes a cramping sensation with bending.  Associated symptoms include nausea (resolved) and constipation.  She denies fever, vomiting, bloody stools.  She denies injury, diet change, abdominal injury, medication changes, frequent NSAID use.     PCP: none Past Medical History  Diagnosis Date  . Migraine   . Chronic pain    Past Surgical History  Procedure Laterality Date  . Head & neck wound repair / closure    . Cervical spine surgery    . Cesarean section     Family History  Problem Relation Age of Onset  . Cancer Mother 54    Breast  . Cancer Paternal Grandmother   . Cancer Paternal Grandfather   . Cancer Maternal Aunt     breast   Social History  Substance Use Topics  . Smoking status: Current Every Day Smoker -- 1.00 packs/day for 19 years    Types: Cigarettes  . Smokeless tobacco: Never Used  . Alcohol Use: Yes     Comment: occas   OB History    Gravida Para Term Preterm AB TAB SAB Ectopic Multiple Living   4 4 4       4      Review of Systems 10 Systems reviewed and all are negative for acute change except as noted in the HPI.  Allergies  Tylenol  Home Medications   Prior to Admission medications   Medication Sig Start Date End Date Taking? Authorizing Provider   cephALEXin (KEFLEX) 500 MG capsule Take 1 capsule (500 mg total) by mouth 3 (three) times daily. Patient not taking: Reported on 08/19/2014 09/13/13   Bethann Berkshire, MD  promethazine (PHENERGAN) 25 MG tablet Take 1 tablet (25 mg total) by mouth every 6 (six) hours as needed for nausea or vomiting. Patient not taking: Reported on 08/19/2014 09/13/13   Bethann Berkshire, MD  ranitidine (ZANTAC) 150 MG capsule Take 1 capsule (150 mg total) by mouth daily. Patient not taking: Reported on 08/19/2014 09/13/13   Bethann Berkshire, MD  valACYclovir (VALTREX) 1000 MG tablet Take 1 tablet (1,000 mg total) by mouth 2 (two) times daily. X 7 days 08/19/14   Cheral Marker, CNM   BP 130/54 mmHg  Pulse 92  Temp(Src) 98 F (36.7 C) (Temporal)  Resp 18  Ht 5\' 1"  (1.549 m)  Wt 210 lb (95.255 kg)  BMI 39.70 kg/m2  SpO2 100%  LMP 05/31/2015 Physical Exam  Constitutional: She is oriented to person, place, and time. She appears well-developed and well-nourished. No distress.  HENT:  Head: Normocephalic and atraumatic.  Eyes: Conjunctivae and EOM are normal.  Neck: Neck supple. No tracheal deviation present.  No cervical lymphadenopathy.   Cardiovascular: Normal rate, regular rhythm and normal heart sounds.   Pulmonary/Chest: Effort normal  and breath sounds normal. No respiratory distress. She has no wheezes.  Lungs CTA.   Chaperone present during examination.  Pain to palpation over left to mid-lower rib area.  No mass appreciated on palpation.    Abdominal:  Epigastric and RUQ TTP.  No mass appreciated.  No hepatomegaly appreciated.  Abdomen is soft.  Bowel sounds are present and active.  In the standing position, pain along the right edge of the psoas muscle.  Could not demonstrate hernia.   Genitourinary:  Mild CVA tenderness bilaterally.  Musculoskeletal: Normal range of motion. She exhibits no edema.  Negative Homan's.  Lymphadenopathy:    She has no cervical adenopathy.  Neurological: She is alert and  oriented to person, place, and time.  Ambulatory without problem.    Skin: Skin is warm and dry.  Psychiatric: She has a normal mood and affect. Her behavior is normal.  Nursing note and vitals reviewed.   ED Course  Procedures (including critical care time)  DIAGNOSTIC STUDIES: Oxygen Saturation is 100% on RA, normal by my interpretation.    COORDINATION OF CARE:  6:21 PM Discussed treatment plan with patient at bedside.  Patient acknowledges and agrees with plan.   Labs Review Labs Reviewed  CBC WITH DIFFERENTIAL/PLATELET - Abnormal; Notable for the following:    Hemoglobin 8.9 (*)    HCT 29.9 (*)    MCV 66.2 (*)    MCH 19.7 (*)    MCHC 29.8 (*)    RDW 18.8 (*)    All other components within normal limits  COMPREHENSIVE METABOLIC PANEL - Abnormal; Notable for the following:    Glucose, Bld 111 (*)    Calcium 8.8 (*)    All other components within normal limits  LIPASE, BLOOD  URINALYSIS, ROUTINE W REFLEX MICROSCOPIC (NOT AT [facility])  POC OCCULT BLOOD, ED    Imaging Review No results found. I have personally reviewed and evaluated these images and lab results as part of my medical decision-making.   EKG Interpretation None      MDM  Pt has RUQ, epigastric, and Right flank pain. MCV and MCH on CBC low, c/w possible iron deficiency. RBC wnl Hgb and HCT low. Lipase wnl. Cmet wnl. Labs discussed with patient. Attempted Rectal exam for occult blood. Pt Refused. Pt now sitting up talking with family, in no distress.Drinking contrast without problem. There are a few small stones with in the right and left kidney, but no ureteral stones on CT. There is some mild to moderate prominence of the liver, but no mass or abscess appreciated. No other acute changes appreciated. These findings were shared with the patient.  Questions concerning the labs and CT were answered. The patient is referred to Dr. Darrick Pennafields for GI evaluation. The patient is advised to increase foods high in  iron, and to use Slow Fe, or similar product to assist with her anemia. The patient acknowledges understanding of these discharge instructions.    Final diagnoses:  None    *I have reviewed nursing notes, vital signs, and all appropriate lab and imaging results for this patient.**  **I personally performed the services described in this documentation, which was scribed in my presence. The recorded information has been reviewed and is accurate.Ivery Quale*   Shandora Koogler, PA-C 06/14/15 2036  Samuel JesterKathleen McManus, DO 06/17/15 1710

## 2015-06-14 NOTE — ED Notes (Signed)
Patient c/o right upper abd pain that has been intermittent "for a while" but has became constant since Friday. Patient reports having some nausea earlier but denies any at this time. Denies any fevers, vomiting, or diarrhea. Per patient right upper abd feels swollen and feels "like something stuck in there." Patient does report cloudy urine but no other urinary symptoms.

## 2015-06-17 ENCOUNTER — Ambulatory Visit (INDEPENDENT_AMBULATORY_CARE_PROVIDER_SITE_OTHER): Payer: BLUE CROSS/BLUE SHIELD | Admitting: Advanced Practice Midwife

## 2015-06-17 ENCOUNTER — Encounter: Payer: Self-pay | Admitting: Advanced Practice Midwife

## 2015-06-17 VITALS — BP 100/70 | HR 94 | Ht 61.0 in | Wt 222.0 lb

## 2015-06-17 DIAGNOSIS — A499 Bacterial infection, unspecified: Secondary | ICD-10-CM

## 2015-06-17 DIAGNOSIS — N760 Acute vaginitis: Secondary | ICD-10-CM | POA: Diagnosis not present

## 2015-06-17 DIAGNOSIS — B9689 Other specified bacterial agents as the cause of diseases classified elsewhere: Secondary | ICD-10-CM

## 2015-06-17 DIAGNOSIS — N898 Other specified noninflammatory disorders of vagina: Secondary | ICD-10-CM

## 2015-06-17 MED ORDER — FLUCONAZOLE 150 MG PO TABS: ORAL_TABLET | ORAL | Status: DC

## 2015-06-17 MED ORDER — METRONIDAZOLE 500 MG PO TABS: 500.0000 mg | ORAL_TABLET | Freq: Two times a day (BID) | ORAL | Status: DC

## 2015-06-17 NOTE — Progress Notes (Signed)
   [facility] Visit  Patient name: Katelyn Hill MRN [id number]  Date of birth: 1981-04-27  CC & HPI:  [name] is a 35 y.o. Caucasian female presenting today for C/O pain with sex, intermittent odor for several months.  Her periods have been heavier with shorter intervals (q 3 weeks) for ~ 6 m onths or so.  Has lower abdominal pain c/w cramps dudring periods. Had CT scan this week d/t upper abd pain--showed some stones in kidneys; uterus/ovaries are normal. Hgb was 8.9, presumably from heavy periods. Has had BTL  Pertinent History Reviewed:  Medical & Surgical Hx:   Past Medical History  Diagnosis Date  . Migraine   . Chronic pain    Past Surgical History  Procedure Laterality Date  . Head & neck wound repair / closure    . Cervical spine surgery    . Cesarean section    . Tubal ligation     Family History  Problem Relation Age of Onset  . Cancer Mother 76    Breast  . Cancer Paternal Grandmother   . Cancer Paternal Grandfather   . Cancer Maternal Aunt     breast    Current outpatient prescriptions:  .  clobetasol cream (TEMOVATE) 0.05 %, APPLY TO RASH BID, Disp: , Rfl: 5 .  valACYclovir (VALTREX) 1000 MG tablet, Take 1 tablet (1,000 mg total) by mouth 2 (two) times daily. X 7 days, Disp: 14 tablet, Rfl: 3 .  fluconazole (DIFLUCAN) 150 MG tablet, 1 po stat; repeat in 3 days, Disp: 2 tablet, Rfl: 2 .  metroNIDAZOLE (FLAGYL) 500 MG tablet, Take 1 tablet (500 mg total) by mouth 2 (two) times daily., Disp: 14 tablet, Rfl: 0 Social History: Reviewed -  reports that she has been smoking Cigarettes.  She has a 19 pack-year smoking history. She has never used smokeless tobacco.  Review of Systems:   Constitutional: Negative for fever and chills Eyes: Negative for visual disturbances Respiratory: Negative for shortness of breath, dyspnea Cardiovascular: Negative for chest pain or palpitations  Gastrointestinal: Negative for vomiting, diarrhea and  constipation; Genitourinary: Negative for dysuria and urgency, Musculoskeletal: Negative for back pain, joint pain, myalgias  Neurological: Negative for dizziness and headaches    Objective Findings:    Physical Examination: General appearance - well appearing, and in no distress Mental status - alert, oriented to person, place, and time Chest:  Normal respiratory effort Heart - normal rate and regular rhythm Abdomen:  Soft, nontender Pelvic: vulva normal. SSE: white frothy discharge with sl aminie odor.  Wet prep + few WBC, few clue. No trich or yeast.  + CMT, c/w dyspareunia.  No blood, cx non friable.   Musculoskeletal:  Normal range of motion without pain Extremities:  No edema  Discussed Mirena vs ablation for heavy periods. Not too interested.   No results found for this or any previous visit (from the past 24 hour(s)).    Assessment & Plan:  A:   BV P:  Flagyl  BID X7d  Info about ablation given.  Consider Mirena  GC/CHL   Start MVI w/Iron  No Follow-up on file.  CRESENZO-DISHMAN,Zyana Amaro CNM 06/17/2015 4:05 PM

## 2015-08-04 ENCOUNTER — Encounter (HOSPITAL_COMMUNITY): Payer: Self-pay | Admitting: Emergency Medicine

## 2015-08-04 ENCOUNTER — Emergency Department (HOSPITAL_COMMUNITY)
Admission: EM | Admit: 2015-08-04 | Discharge: 2015-08-04 | Disposition: A | Discharge status: Home / Self Care | Payer: BLUE CROSS/BLUE SHIELD | Attending: Emergency Medicine | Admitting: Emergency Medicine

## 2015-08-04 DIAGNOSIS — T7840XA Allergy, unspecified, initial encounter: Secondary | ICD-10-CM | POA: Diagnosis not present

## 2015-08-04 DIAGNOSIS — L259 Unspecified contact dermatitis, unspecified cause: Secondary | ICD-10-CM

## 2015-08-04 DIAGNOSIS — F17210 Nicotine dependence, cigarettes, uncomplicated: Secondary | ICD-10-CM | POA: Diagnosis not present

## 2015-08-04 MED ORDER — FAMOTIDINE 20 MG PO TABS: 20.0000 mg | ORAL_TABLET | Freq: Two times a day (BID) | ORAL | Status: DC

## 2015-08-04 MED ORDER — PREDNISONE 10 MG PO TABS: 60.0000 mg | ORAL_TABLET | Freq: Every day | ORAL | Status: DC

## 2015-08-04 MED ORDER — DIPHENHYDRAMINE HCL 25 MG PO TABS: 25.0000 mg | ORAL_TABLET | Freq: Four times a day (QID) | ORAL | Status: DC

## 2015-08-04 NOTE — ED Provider Notes (Signed)
CSN: [id number]     Arrival date & time 08/04/15  1248 History   First MD Initiated Contact with Patient 08/04/15 1418     Chief Complaint  Patient presents with  . Allergic Reaction      HPI Patient reports that she dyed her hair approximately 5 days ago.  She's had itching of her scalp since then.  No history of asthma or eczema.  No difficulty breathing or swallowing.  She tried Benadryl without improvement in her symptoms.  She continues to itch throughout her scalp is now itching diffusely as well.  No hives reported.   Past Medical History  Diagnosis Date  . Migraine   . Chronic pain    Past Surgical History  Procedure Laterality Date  . Head & neck wound repair / closure    . Cervical spine surgery    . Cesarean section    . Tubal ligation     Family History  Problem Relation Age of Onset  . Cancer Mother 6536    Breast  . Cancer Paternal Grandmother   . Cancer Paternal Grandfather   . Cancer Maternal Aunt     breast   Social History  Substance Use Topics  . Smoking status: Current Every Day Smoker -- 1.00 packs/day for 19 years    Types: Cigarettes  . Smokeless tobacco: Never Used  . Alcohol Use: Yes     Comment: occas   OB History    Gravida Para Term Preterm AB TAB SAB Ectopic Multiple Living   4 4 4       4      Review of Systems  All other systems reviewed and are negative.     Allergies  Tylenol  Home Medications   Prior to Admission medications   Medication Sig Start Date End Date Taking? Authorizing Provider  clobetasol cream (TEMOVATE) 0.05 % APPLY TO RASH BID 05/07/15   Historical Provider, MD  diphenhydrAMINE (BENADRYL) 25 MG tablet Take 1 tablet (25 mg total) by mouth every 6 (six) hours. 08/04/15   Azalia BilisKevin Goldman Birchall, MD  famotidine (PEPCID) 20 MG tablet Take 1 tablet (20 mg total) by mouth 2 (two) times daily. 08/04/15   Azalia BilisKevin Bronte Kropf, MD  fluconazole (DIFLUCAN) 150 MG tablet 1 po stat; repeat in 3 days 06/17/15   Jacklyn ShellFrances Cresenzo-Dishmon, CNM   metroNIDAZOLE (FLAGYL) 500 MG tablet Take 1 tablet (500 mg total) by mouth 2 (two) times daily. 06/17/15   Jacklyn ShellFrances Cresenzo-Dishmon, CNM  predniSONE (DELTASONE) 10 MG tablet Take 6 tablets (60 mg total) by mouth daily. 08/04/15   Azalia BilisKevin Brahim Dolman, MD  valACYclovir (VALTREX) 1000 MG tablet Take 1 tablet (1,000 mg total) by mouth 2 (two) times daily. X 7 days 08/19/14   Cheral MarkerKimberly R Booker, CNM   BP 103/58 mmHg  Pulse 88  Temp(Src) 98.6 F (37 C) (Oral)  Resp 16  Ht 5\' 1"  (1.549 m)  Wt 220 lb (99.791 kg)  BMI 41.59 kg/m2  SpO2 100%  LMP 07/31/2015 Physical Exam  Constitutional: She is oriented to person, place, and time. She appears well-developed and well-nourished.  HENT:  Head: Normocephalic.  Mild irritation and erythema of her scalp without focal hives.  No significant warmth.  Eyes: EOM are normal.  Neck: Normal range of motion.  Cardiovascular: Normal rate.   Pulmonary/Chest: Effort normal.  Abdominal: She exhibits no distension.  Musculoskeletal: Normal range of motion.  Neurological: She is alert and oriented to person, place, and time.  Skin:  No hives noted  Psychiatric: She has a normal mood and affect.  Nursing note and vitals reviewed.   ED Course  Procedures (including critical care time) Labs Review Labs Reviewed - No data to display  Imaging Review No results found. I have personally reviewed and evaluated these images and lab results as part of my medical decision-making.   EKG Interpretation None      MDM   Final diagnoses:  Allergic reaction, initial encounter  Contact dermatitis    Will treat patient is a contact dermatitis.  Will treat with steroids, Pepcid, Benadryl.    Azalia Bilis, MD 08/04/15 [phone]

## 2015-08-04 NOTE — Discharge Instructions (Signed)
Contact Dermatitis Dermatitis is redness, soreness, and swelling (inflammation) of the skin. Contact dermatitis is a reaction to certain substances that touch the skin. There are two types of contact dermatitis:   Irritant contact dermatitis. This type is caused by something that irritates your skin, such as dry hands from washing them too much. This type does not require previous exposure to the substance for a reaction to occur. This type is more common.  Allergic contact dermatitis. This type is caused by a substance that you are allergic to, such as a nickel allergy or poison ivy. This type only occurs if you have been exposed to the substance (allergen) before. Upon a repeat exposure, your body reacts to the substance. This type is less common. CAUSES  Many different substances can cause contact dermatitis. Irritant contact dermatitis is most commonly caused by exposure to:   Makeup.   Soaps.   Detergents.   Bleaches.   Acids.   Metal salts, such as nickel.  Allergic contact dermatitis is most commonly caused by exposure to:   Poisonous plants.   Chemicals.   Jewelry.   Latex.   Medicines.   Preservatives in products, such as clothing.  RISK FACTORS This condition is more likely to develop in:   People who have jobs that expose them to irritants or allergens.  People who have certain medical conditions, such as asthma or eczema.  SYMPTOMS  Symptoms of this condition may occur anywhere on your body where the irritant has touched you or is touched by you. Symptoms include:  Dryness or flaking.   Redness.   Cracks.   Itching.   Pain or a burning feeling.   Blisters.  Drainage of small amounts of blood or clear fluid from skin cracks. With allergic contact dermatitis, there may also be swelling in areas such as the eyelids, mouth, or genitals.  DIAGNOSIS  This condition is diagnosed with a medical history and physical exam. A patch skin test  may be performed to help determine the cause. If the condition is related to your job, you may need to see an occupational medicine specialist. TREATMENT Treatment for this condition includes figuring out what caused the reaction and protecting your skin from further contact. Treatment may also include:   Steroid creams or ointments. Oral steroid medicines may be needed in more severe cases.  Antibiotics or antibacterial ointments, if a skin infection is present.  Antihistamine lotion or an antihistamine taken by mouth to ease itching.  A bandage (dressing). HOME CARE INSTRUCTIONS Skin Care  Moisturize your skin as needed.   Apply cool compresses to the affected areas.  Try taking a bath with:  Epsom salts. Follow the instructions on the packaging. You can get these at your local pharmacy or grocery store.  Baking soda. Pour a small amount into the bath as directed by your health care provider.  Colloidal oatmeal. Follow the instructions on the packaging. You can get this at your local pharmacy or grocery store.  Try applying baking soda paste to your skin. Stir water into baking soda until it reaches a paste-like consistency.  Do not scratch your skin.  Bathe less frequently, such as every other day.  Bathe in lukewarm water. Avoid using hot water. Medicines  Take or apply over-the-counter and prescription medicines only as told by your health care provider.   If you were prescribed an antibiotic medicine, take or apply your antibiotic as told by your health care provider. Do not stop using the   antibiotic even if your condition starts to improve. General Instructions  Keep all follow-up visits as told by your health care provider. This is important.  Avoid the substance that caused your reaction. If you do not know what caused it, keep a journal to try to track what caused it. Write down:  What you eat.  What cosmetic products you use.  What you drink.  What  you wear in the affected area. This includes jewelry.  If you were given a dressing, take care of it as told by your health care provider. This includes when to change and remove it. SEEK MEDICAL CARE IF:   Your condition does not improve with treatment.  Your condition gets worse.  You have signs of infection such as swelling, tenderness, redness, soreness, or warmth in the affected area.  You have a fever.  You have new symptoms. SEEK IMMEDIATE MEDICAL CARE IF:   You have a severe headache, neck pain, or neck stiffness.  You vomit.  You feel very sleepy.  You notice red streaks coming from the affected area.  Your bone or joint underneath the affected area becomes painful after the skin has healed.  The affected area turns darker.  You have difficulty breathing.   This information is not intended to replace advice given to you by your health care provider. Make sure you discuss any questions you have with your health care provider.   Document Released: 05/13/2000 Document Revised: 02/04/2015 Document Reviewed: 10/01/2014 Elsevier Interactive Patient Education 2016 [employer]  

## 2015-08-04 NOTE — ED Notes (Signed)
Pt states she dyed her hair Thursday, reports itching from head to toe and neck swelling. No SOB noted, no angioedema noted.

## 2015-08-04 NOTE — ED Notes (Signed)
MD at bedside. 

## 2015-08-06 ENCOUNTER — Ambulatory Visit (INDEPENDENT_AMBULATORY_CARE_PROVIDER_SITE_OTHER): Payer: BLUE CROSS/BLUE SHIELD | Admitting: Otolaryngology

## 2016-09-02 ENCOUNTER — Encounter (HOSPITAL_COMMUNITY): Payer: Self-pay

## 2016-09-02 ENCOUNTER — Emergency Department (HOSPITAL_COMMUNITY)
Admission: EM | Admit: 2016-09-02 | Discharge: 2016-09-02 | Disposition: A | Discharge status: Home / Self Care | Payer: BLUE CROSS/BLUE SHIELD | Attending: Emergency Medicine | Admitting: Emergency Medicine

## 2016-09-02 DIAGNOSIS — R0602 Shortness of breath: Secondary | ICD-10-CM | POA: Diagnosis not present

## 2016-09-02 DIAGNOSIS — F17210 Nicotine dependence, cigarettes, uncomplicated: Secondary | ICD-10-CM | POA: Diagnosis not present

## 2016-09-02 DIAGNOSIS — R079 Chest pain, unspecified: Secondary | ICD-10-CM | POA: Insufficient documentation

## 2016-09-02 DIAGNOSIS — R002 Palpitations: Secondary | ICD-10-CM | POA: Insufficient documentation

## 2016-09-02 LAB — I-STAT TROPONIN, ED: Troponin i, poc: 0 ng/mL (ref 0.00–0.08)

## 2016-09-02 LAB — I-STAT CHEM 8, ED
BUN: 12 mg/dL (ref 6–20)
Calcium, Ion: 1.17 mmol/L (ref 1.15–1.40)
Chloride: 104 mmol/L (ref 101–111)
Creatinine, Ser: 0.5 mg/dL (ref 0.44–1.00)
Glucose, Bld: 91 mg/dL (ref 65–99)
HCT: 39 % (ref 36.0–46.0)
Hemoglobin: 13.3 g/dL (ref 12.0–15.0)
Potassium: 4 mmol/L (ref 3.5–5.1)
Sodium: 138 mmol/L (ref 135–145)
TCO2: 25 mmol/L (ref 0–100)

## 2016-09-02 MED ORDER — SODIUM CHLORIDE 0.9 % IV BOLUS (SEPSIS)
1000.0000 mL | Freq: Once | INTRAVENOUS | Status: AC
  Administered 2016-09-02: 1000 mL via INTRAVENOUS

## 2016-09-02 NOTE — ED Provider Notes (Signed)
[facility]EMERGENCY DEPT Provider Note   CSN: [id number] Arrival date & time: 09/02/16  1938   By signing my name below, I, Bobbie Stack, attest that this documentation has been prepared under the direction and in the presence of Marily Memos, MD. Electronically Signed: Bobbie Stack, Scribe. 09/02/16. 8:40 PM. History   Chief Complaint Chief Complaint  Patient presents with  . Chest Pain    The history is provided by the patient. No language interpreter was used.  HPI Comments: [name] is a 36 y.o. female who presents to the Emergency Department complaining of palpitations that began 2 nights ago. The patient states that there was "pounding in her chest" throughout that night. The patient has been on phentermine for the past couple of months. She reports taking this medication later in the day than she typically takes it. She reports drinking vodka with cranberry juice 2 nights ago and the palpitations began afterwards. The palpitations resolved the next day but she has been experiencing chest, neck, back, and left shoulder soreness since yesterday. The soreness is still present. The patient also reports some SOB. The patient is a current smoker. She has a hx of asthma and anxiety. She denies leg swelling.  Past Medical History:  Diagnosis Date  . Chronic pain   . Migraine     Patient Active Problem List   Diagnosis Date Noted  . Smoker 08/19/2014  . Female pelvic pain 08/19/2014  . Family history of breast cancer in mother 08/19/2014    Past Surgical History:  Procedure Laterality Date  . CERVICAL SPINE SURGERY    . CESAREAN SECTION    . HEAD & NECK WOUND REPAIR / CLOSURE    . TUBAL LIGATION      OB History    Gravida Para Term Preterm AB Living   SAB TAB Ectopic Multiple Live Births                   Home Medications    Prior to Admission medications   Medication Sig Start Date End Date Taking? Authorizing Provider  phentermine 37.5 MG  capsule Take 37.5 mg by mouth every morning.   Yes Historical Provider, MD    Family History Family History  Problem Relation Age of Onset  . Cancer Mother 37    Breast  . Cancer Paternal Grandmother   . Cancer Paternal Grandfather   . Cancer Maternal Aunt     breast    Social History Social History  Substance Use Topics  . Smoking status: Current Every Day Smoker    Packs/day: 1.00    Years: 19.00    Types: Cigarettes  . Smokeless tobacco: Never Used  . Alcohol use Yes     Comment: occas     Allergies   Patient has no known allergies.   Review of Systems Review of Systems  Constitutional: Negative for fever.  Respiratory: Positive for shortness of breath.   Cardiovascular: Positive for chest pain and palpitations.  Gastrointestinal: Negative for abdominal pain, nausea and vomiting.  Musculoskeletal: Positive for back pain.  All other systems reviewed and are negative.    Physical Exam Updated Vital Signs BP 115/74 (BP Location: Left Arm)   Pulse 78   Temp 97.9 F (36.6 C) (Oral)   Resp 19   Ht  (1.549 m)   Wt 190 lb (86.2 kg)   LMP 07/27/2016 (Approximate)   SpO2 100%  BMI 35.90 kg/m   Physical Exam  Constitutional: She is oriented to person, place, and time. She appears well-developed and well-nourished.  HENT:  Head: Normocephalic.  Eyes: Conjunctivae and EOM are normal.  Neck: Normal range of motion.  Cardiovascular: Normal rate and regular rhythm.  Exam reveals no gallop and no friction rub.   No murmur heard. No m/r/g.  Pulmonary/Chest: Effort normal. No respiratory distress. She has no wheezes. She exhibits no tenderness.  No chest tenderness. Lungs clear.  Abdominal: She exhibits no distension.  Musculoskeletal: Normal range of motion.  Neurological: She is alert and oriented to person, place, and time.  Skin: Skin is warm and dry.  Psychiatric: She has a normal mood and affect.  Nursing note and vitals reviewed.    ED  Treatments / Results  DIAGNOSTIC STUDIES: Oxygen Saturation is 100% on RA, normal by my interpretation.    COORDINATION OF CARE: 8:16 PM Discussed treatment plan with pt at bedside and pt agreed to plan. I will do a full cardiac work-up for the patient. I will check her EKG and troponin levels.  Labs (all labs ordered are listed, but only abnormal results are displayed) Labs Reviewed  Rosezena Sensor, ED  I-STAT CHEM 8, ED    EKG  EKG Interpretation  Date/Time:  Friday September 02 2016 19:50:30 EDT Ventricular Rate:  85 PR Interval:    QRS Duration: 85 QT Interval:  368 QTC Calculation: 438 R Axis:   89 Text Interpretation:  Sinus rhythm No old tracing to compare Confirmed by [facility] MD, Barbara Cower [id number]) on 09/02/2016 9:17:32 PM       Radiology No results found.  Procedures Procedures (including critical care time)  Medications Ordered in ED Medications  sodium chloride 0.9 % bolus 1,000 mL (0 mLs Intravenous Stopped 09/02/16 2210)     Initial Impression / Assessment and Plan / ED Course  I have reviewed the triage vital signs and the nursing notes.  Pertinent labs & imaging results that were available during my care of the patient were reviewed by me and considered in my medical decision making (see chart for details).     Palpitations possibly related to interaction of phentermine and nicotine.  Doubt ACS.  No other increased sympathomimetics.  No RF, Doubt PE.  Possibly anxiety related, but states symptoms started prior to anxiety so less likely.  Will follow up with PCP for consideration of holter monitor.  Final Clinical Impressions(s) / ED Diagnoses   Final diagnoses:  Palpitations    New Prescriptions Discharge Medication List as of 09/02/2016 10:00 PM     I personally performed the services described in this documentation, which was scribed in my presence. The recorded information has been reviewed and is accurate.    Marily Memos, MD 09/02/16 [phone]

## 2016-09-02 NOTE — ED Triage Notes (Signed)
Pt reports left chest pain that goes up into her left shoulder x 3 days.  Pt states she has been on phentermine for a couple of months and on Wednesday night she had some alcohol and thinks that may have caused the pain

## 2016-09-02 NOTE — ED Notes (Signed)
Pt states understanding of care given and follow up instructions.  Pt a/o, ambulated with steady gait from ED 

## 2017-04-26 ENCOUNTER — Encounter (HOSPITAL_COMMUNITY): Payer: Self-pay | Admitting: *Deleted

## 2017-04-26 ENCOUNTER — Other Ambulatory Visit: Payer: Self-pay

## 2017-04-26 ENCOUNTER — Emergency Department (HOSPITAL_COMMUNITY)
Admission: EM | Admit: 2017-04-26 | Discharge: 2017-04-26 | Disposition: A | Discharge status: Home / Self Care | Payer: BLUE CROSS/BLUE SHIELD | Attending: Emergency Medicine | Admitting: Emergency Medicine

## 2017-04-26 DIAGNOSIS — F17210 Nicotine dependence, cigarettes, uncomplicated: Secondary | ICD-10-CM | POA: Diagnosis not present

## 2017-04-26 DIAGNOSIS — Z79899 Other long term (current) drug therapy: Secondary | ICD-10-CM | POA: Insufficient documentation

## 2017-04-26 DIAGNOSIS — L235 Allergic contact dermatitis due to other chemical products: Secondary | ICD-10-CM | POA: Insufficient documentation

## 2017-04-26 DIAGNOSIS — T7840XA Allergy, unspecified, initial encounter: Secondary | ICD-10-CM | POA: Insufficient documentation

## 2017-04-26 DIAGNOSIS — L299 Pruritus, unspecified: Secondary | ICD-10-CM | POA: Diagnosis present

## 2017-04-26 MED ORDER — FAMOTIDINE 20 MG PO TABS
20.0000 mg | ORAL_TABLET | Freq: Once | ORAL | Status: AC
  Administered 2017-04-26: 20 mg via ORAL
  Filled 2017-04-26: qty 1

## 2017-04-26 MED ORDER — PREDNISONE 50 MG PO TABS
60.0000 mg | ORAL_TABLET | Freq: Once | ORAL | Status: AC
  Administered 2017-04-26: 60 mg via ORAL
  Filled 2017-04-26: qty 1

## 2017-04-26 MED ORDER — DIPHENHYDRAMINE HCL 25 MG PO CAPS
25.0000 mg | ORAL_CAPSULE | Freq: Once | ORAL | Status: AC
  Administered 2017-04-26: 25 mg via ORAL
  Filled 2017-04-26: qty 1

## 2017-04-26 MED ORDER — PREDNISONE 20 MG PO TABS: 40.0000 mg | ORAL_TABLET | Freq: Every day | ORAL | 0 refills | Status: DC

## 2017-04-26 NOTE — Discharge Instructions (Signed)
Take either loratadine (Claritin) or cetirizine (Zyrtec) once a day. Take either famotidine (Pepcid AC) or ranitidine (Zantac) twice a day. Take diphenhydramine (Benadryl) every four hours as needed for break-through itching or burning.3  Talk with your doctor about possible referral to an allergist.

## 2017-04-26 NOTE — ED Provider Notes (Signed)
[facility] Provider [facility]   CSN: [id number] Arrival date & time: 04/26/17  0450     History   Chief Complaint Chief Complaint  Patient presents with  . Allergic Reaction    HPI [place] is a 36 y.o. female.  The history is provided by the patient.  Allergic Reaction  She has a history of having had allergic reactions to hair coloring agents.  She had her hair colored yesterday.  She was assured that the product was organic and natural and would not cause a reaction.  However, she has developed burning and itching in her scalp and some swelling behind her ears typical of her prior reactions.  She relates that diphenhydramine is not been effective for her in the past.  She denies any difficulty breathing or swallowing.  Past Medical History:  Diagnosis Date  . Chronic pain   . Migraine     Patient Active Problem List   Diagnosis Date Noted  . Smoker 08/19/2014  . Female pelvic pain 08/19/2014  . Family history of breast cancer in mother 08/19/2014    Past Surgical History:  Procedure Laterality Date  . CERVICAL SPINE SURGERY    . CESAREAN SECTION    . HEAD & NECK WOUND REPAIR / CLOSURE    . TUBAL LIGATION      OB History    Gravida Para Term Preterm AB Living   4 4 4     4    SAB TAB Ectopic Multiple Live Births                   Home Medications    Prior to Admission medications   Medication Sig Start Date End Date Taking? Authorizing Provider  phentermine 37.5 MG capsule Take 37.5 mg by mouth every morning.    [provider]    Family History Family History  Problem Relation Age of Onset  . Cancer Mother 4836       Breast  . Cancer Paternal Grandmother   . Cancer Paternal Grandfather   . Cancer Maternal Aunt        breast    Social History Social History   Tobacco Use  . Smoking status: Current Every Day Smoker    Packs/day: 1.00    Years: 19.00    Pack years: 19.00    Types: Cigarettes  . Smokeless  tobacco: Never Used  Substance Use Topics  . Alcohol use: Yes    Comment: occas  . Drug use: No     Allergies   Patient has no known allergies.   Review of Systems Review of Systems  All other systems reviewed and are negative.    Physical Exam Updated Vital Signs BP 134/68 (BP Location: Left Arm)   Pulse 83   Temp 98.3 F (36.8 C) (Oral)   Resp 18   Ht 5\' 1"  (1.549 m)   Wt 86.2 kg (190 lb)   SpO2 99%   BMI 35.90 kg/m   Physical Exam  Nursing note and vitals reviewed.  36 year old female, resting comfortably and in no acute distress. Vital signs are normal. Oxygen saturation is 99%, which is normal. Head is normocephalic and atraumatic. PERRLA, EOMI. Oropharynx is clear. Neck is nontender and supple without adenopathy or JVD. Back is nontender and there is no CVA tenderness. Lungs are clear without rales, wheezes, or rhonchi. Chest is nontender. Heart has regular rate and rhythm without murmur. Abdomen is soft, flat, nontender without  masses or hepatosplenomegaly and peristalsis is normoactive. Extremities have no cyanosis or edema, full range of motion is present. Skin is warm and dry.  Scalp has mild to moderate erythema with some slight swelling in the periauricular areas bilaterally. Neurologic: Mental status is normal, cranial nerves are intact, there are no motor or sensory deficits.  ED Treatments / Results   Procedures Procedures (including critical care time)  Medications Ordered in ED Medications  predniSONE (DELTASONE) tablet 60 mg (not administered)  famotidine (PEPCID) tablet 20 mg (not administered)  diphenhydrAMINE (BENADRYL) capsule 25 mg (not administered)     Initial Impression / Assessment and Plan / ED Course  I have reviewed the triage vital signs and the nursing notes.  Contact dermatitis.  She is given dose of prednisone, famotidine, diphenhydramine.  She is discharged with prescription for prednisone advised to use over-the-counter  loratadine or cetirizine, ranitidine or famotidine.  She is to use diphenhydramine for breakthrough symptoms.  Discussed possible referral to an allergist.  Final Clinical Impressions(s) / ED Diagnoses   Final diagnoses:  Allergic dermatitis due to other chemical product    ED Discharge Orders        Ordered    predniSONE (DELTASONE) 20 MG tablet  Daily     04/26/17 0516       Dione BoozeGlick, Yonathan Perrow, MD 04/26/17 Jeralyn [id number]

## 2017-04-26 NOTE — ED Triage Notes (Signed)
Pt states a reaction to hair dye that was done yesterday. Says lymph node swollen & head hurting.

## 2017-08-24 ENCOUNTER — Ambulatory Visit (INDEPENDENT_AMBULATORY_CARE_PROVIDER_SITE_OTHER): Payer: BLUE CROSS/BLUE SHIELD | Admitting: Otolaryngology

## 2017-08-24 DIAGNOSIS — H6123 Impacted cerumen, bilateral: Secondary | ICD-10-CM | POA: Diagnosis not present

## 2018-02-22 ENCOUNTER — Emergency Department (HOSPITAL_COMMUNITY)
Admission: EM | Admit: 2018-02-22 | Discharge: 2018-02-22 | Disposition: A | Discharge status: Home / Self Care | Payer: BLUE CROSS/BLUE SHIELD | Attending: Emergency Medicine | Admitting: Emergency Medicine

## 2018-02-22 ENCOUNTER — Other Ambulatory Visit: Payer: Self-pay

## 2018-02-22 ENCOUNTER — Encounter (HOSPITAL_COMMUNITY): Payer: Self-pay | Admitting: *Deleted

## 2018-02-22 DIAGNOSIS — N938 Other specified abnormal uterine and vaginal bleeding: Secondary | ICD-10-CM | POA: Insufficient documentation

## 2018-02-22 DIAGNOSIS — F17210 Nicotine dependence, cigarettes, uncomplicated: Secondary | ICD-10-CM | POA: Insufficient documentation

## 2018-02-22 DIAGNOSIS — Z79899 Other long term (current) drug therapy: Secondary | ICD-10-CM | POA: Insufficient documentation

## 2018-02-22 HISTORY — DX: Excessive and frequent menstruation with regular cycle: N92.0

## 2018-02-22 LAB — CBC WITH DIFFERENTIAL/PLATELET
Basophils Absolute: 0 10*3/uL (ref 0.0–0.1)
Basophils Relative: 1 %
Eosinophils Absolute: 0.1 10*3/uL (ref 0.0–0.7)
Eosinophils Relative: 2 %
HCT: 32.4 % — ABNORMAL LOW (ref 36.0–46.0)
Hemoglobin: 9.2 g/dL — ABNORMAL LOW (ref 12.0–15.0)
Lymphocytes Relative: 25 %
Lymphs Abs: 1.7 10*3/uL (ref 0.7–4.0)
MCH: 19 pg — ABNORMAL LOW (ref 26.0–34.0)
MCHC: 28.4 g/dL — ABNORMAL LOW (ref 30.0–36.0)
MCV: 66.8 fL — ABNORMAL LOW (ref 78.0–100.0)
Monocytes Absolute: 0.5 10*3/uL (ref 0.1–1.0)
Monocytes Relative: 6 %
Neutro Abs: 4.2 10*3/uL (ref 1.7–7.7)
Neutrophils Relative %: 66 %
Platelets: 229 10*3/uL (ref 150–400)
RBC: 4.85 MIL/uL (ref 3.87–5.11)
RDW: 19.4 % — ABNORMAL HIGH (ref 11.5–15.5)
WBC: 6.5 10*3/uL (ref 4.0–10.5)

## 2018-02-22 LAB — BASIC METABOLIC PANEL
Anion gap: 5 (ref 5–15)
BUN: 12 mg/dL (ref 6–20)
CO2: 26 mmol/L (ref 22–32)
Calcium: 8.6 mg/dL — ABNORMAL LOW (ref 8.9–10.3)
Chloride: 105 mmol/L (ref 98–111)
Creatinine, Ser: 0.55 mg/dL (ref 0.44–1.00)
GFR calc Af Amer: >60 mL/min (ref >60)
GFR calc non Af Amer: >60 mL/min (ref >60)
Glucose, Bld: 107 mg/dL — ABNORMAL HIGH (ref 70–99)
Potassium: 3.7 mmol/L (ref 3.5–5.1)
Sodium: 136 mmol/L (ref 135–145)

## 2018-02-22 LAB — HCG, QUANTITATIVE, PREGNANCY: hCG, Beta Chain, Quant, S: <1 m[IU]/mL (ref <5)

## 2018-02-22 MED ORDER — SODIUM CHLORIDE 0.9 % IV BOLUS
1000.0000 mL | Freq: Once | INTRAVENOUS | Status: AC
  Administered 2018-02-22: 1000 mL via INTRAVENOUS

## 2018-02-22 NOTE — ED Triage Notes (Signed)
Pt c/o heavy vaginal bleeding and states she feels weak today; pt states she has gone through a box of tampons in 3 days; pt states she started bleeding x 3 days ago

## 2018-02-22 NOTE — ED Notes (Signed)
Pt requesting blood work, pt refusing pelvic exam

## 2018-02-22 NOTE — Discharge Instructions (Addendum)
Take your usual prescriptions as previously directed.  Call your regular OB/GYN doctor tomorrow to schedule a follow up appointment within the next week.  Return to the Emergency Department immediately sooner if worsening.

## 2018-02-22 NOTE — ED Provider Notes (Signed)
[facility] EMERGENCY DEPARTMENT Provider Note   CSN: [id number] Arrival date & time: 02/22/18  1821     History   Chief Complaint Chief Complaint  Patient presents with  . Vaginal Bleeding    HPI Katelyn Hill is a 37 y.o. female.  HPI  Pt was seen at 2000. Per pt, c/o gradual onset and persistence of constant acute flair of her chronic/recurrent "heavy period" that began 3 days ago. Pt states she has spoken to her OB/GYN MD regarding this and "they wanted to do an ablation but I said no." Pt states she came to the ED today "to make sure my hemoglobin was OK." States she does not want a pelvic exam or any other dx testing. Denies abd pain, no N/V/D, no dysuria/hematuria, no vaginal discharge, no back pain.    Past Medical History:  Diagnosis Date  . Chronic pain   . Menorrhagia   . Migraine     Patient Active Problem List   Diagnosis Date Noted  . Smoker 08/19/2014  . Female pelvic pain 08/19/2014  . Family history of breast cancer in mother 08/19/2014    Past Surgical History:  Procedure Laterality Date  . CERVICAL SPINE SURGERY    . CESAREAN SECTION    . HEAD & NECK WOUND REPAIR / CLOSURE    . TUBAL LIGATION       OB History    Gravida  4   Para  4   Term  4   Preterm      AB      Living  4     SAB      TAB      Ectopic      Multiple      Live Births               Home Medications    Prior to Admission medications   Medication Sig Start Date End Date Taking? Authorizing Provider  phentermine 37.5 MG capsule Take 37.5 mg by mouth every morning.    [provider]  predniSONE (DELTASONE) 20 MG tablet Take 2 tablets (40 mg total) by mouth daily. 04/26/17   Dione Booze, MD    Family History Family History  Problem Relation Age of Onset  . Cancer Mother 58       Breast  . Cancer Paternal Grandmother   . Cancer Paternal Grandfather   . Cancer Maternal Aunt        breast    Social History Social History    Tobacco Use  . Smoking status: Current Every Day Smoker    Packs/day: 1.00    Years: 19.00    Pack years: 19.00    Types: Cigarettes  . Smokeless tobacco: Never Used  Substance Use Topics  . Alcohol use: Yes    Comment: occas  . Drug use: No     Allergies   Patient has no known allergies.   Review of Systems Review of Systems ROS: Statement: All systems negative except as marked or noted in the HPI; Constitutional: Negative for fever and chills. ; ; Eyes: Negative for eye pain, redness and discharge. ; ; ENMT: Negative for ear pain, hoarseness, nasal congestion, sinus pressure and sore throat. ; ; Cardiovascular: Negative for chest pain, palpitations, diaphoresis, dyspnea and peripheral edema. ; ; Respiratory: Negative for cough, wheezing and stridor. ; ; Gastrointestinal: Negative for nausea, vomiting, diarrhea, abdominal pain, blood in stool, hematemesis, jaundice and rectal bleeding. . ; ; Genitourinary:  Negative for dysuria, flank pain and hematuria. ; ; Musculoskeletal: Negative for back pain and neck pain. Negative for swelling and trauma.; ; GYN:  No pelvic pain, +vaginal bleeding, no vaginal discharge, no vulvar pain. ;; Skin: Negative for pruritus, rash, abrasions, blisters, bruising and skin lesion.; ; Neuro: Negative for headache, lightheadedness and neck stiffness. Negative for weakness, altered level of consciousness, altered mental status, extremity weakness, paresthesias, involuntary movement, seizure and syncope.       Physical Exam Updated Vital Signs BP 113/71 (BP Location: Right Arm)   Pulse 84   Temp 97.7 F (36.5 C) (Temporal)   Resp 20   Ht 5\' 1"  (1.549 m)   Wt 83.9 kg   LMP 02/19/2018   SpO2 98%   BMI 34.96 kg/m    20:14 Orthostatic Vital Signs MW  Orthostatic Lying   BP- Lying: 115/63   Pulse- Lying: 64       Orthostatic Sitting  BP- Sitting: 95/58   Pulse- Sitting: 81       Orthostatic Standing at 0 minutes  BP- Standing at 0 minutes:  98/64   Pulse- Standing at 0 minutes: 88       Orthostatic Standing at 3 minutes  BP- Standing at 3 minutes: 103/66   Pulse- Standing at 3 minutes: 86     22:10:39 Orthostatic Vital Signs BD  Orthostatic Lying   BP- Lying: 102/52   Pulse- Lying: 81       Orthostatic Sitting  BP- Sitting: 110/70   Pulse- Sitting: 79       Orthostatic Standing at 0 minutes  BP- Standing at 0 minutes: 107/66   Pulse- Standing at 0 minutes: 85      Physical Exam 2005: Physical examination:  Nursing notes reviewed; Vital signs and O2 SAT reviewed;  Constitutional: Well developed, Well nourished, Well hydrated, In no acute distress; Head:  Normocephalic, atraumatic; Eyes: EOMI, PERRL, No scleral icterus; ENMT: Mouth and pharynx normal, Mucous membranes moist; Neck: Supple, Full range of motion, No lymphadenopathy; Cardiovascular: Regular rate and rhythm, No gallop; Respiratory: Breath sounds clear & equal bilaterally, No wheezes.  Speaking full sentences with ease, Normal respiratory effort/excursion; Chest: Nontender, Movement normal; Abdomen: Soft, Nontender, Nondistended, Normal bowel sounds; Genitourinary: No CVA tenderness; Extremities: Peripheral pulses normal, No tenderness, No edema, No calf edema or asymmetry.; Neuro: AA&Ox3, Major CN grossly intact.  Speech clear. No gross focal motor or sensory deficits in extremities.; Skin: Color normal, Warm, Dry.   ED Treatments / Results  Labs (all labs ordered are listed, but only abnormal results are displayed)   EKG None  Radiology   Procedures Procedures (including critical care time)  Medications Ordered in ED Medications - No data to display   Initial Impression / Assessment and Plan / ED Course  I have reviewed the triage vital signs and the nursing notes.  Pertinent labs & imaging results that were available during my care of the patient were reviewed by me and considered in my medical decision making (see chart for  details).  MDM Reviewed: previous chart, nursing note and vitals Reviewed previous: labs Interpretation: labs   Results for orders placed or performed during the hospital encounter of 02/22/18  CBC with Differential  Result Value Ref Range   WBC 6.5 4.0 - 10.5 K/uL   RBC 4.85 3.87 - 5.11 MIL/uL   Hemoglobin 9.2 (L) 12.0 - 15.0 g/dL   HCT 16.1 (L) 09.6 - 04.5 %   MCV 66.8 (L) 78.0 - 100.0  fL   MCH 19.0 (L) 26.0 - 34.0 pg   MCHC 28.4 (L) 30.0 - 36.0 g/dL   RDW 16.1 (H) 09.6 - 04.5 %   Platelets 229 150 - 400 K/uL   Neutrophils Relative % 66 %   Neutro Abs 4.2 1.7 - 7.7 K/uL   Lymphocytes Relative 25 %   Lymphs Abs 1.7 0.7 - 4.0 K/uL   Monocytes Relative 6 %   Monocytes Absolute 0.5 0.1 - 1.0 K/uL   Eosinophils Relative 2 %   Eosinophils Absolute 0.1 0.0 - 0.7 K/uL   Basophils Relative 1 %   Basophils Absolute 0.0 0.0 - 0.1 K/uL   WBC Morphology WHITE COUNT CONFIRMED ON SMEAR   hCG, quantitative, pregnancy  Result Value Ref Range   hCG, Beta Chain, Quant, S <1 <5 mIU/mL  Basic metabolic panel  Result Value Ref Range   Sodium 136 135 - 145 mmol/L   Potassium 3.7 3.5 - 5.1 mmol/L   Chloride 105 98 - 111 mmol/L   CO2 26 22 - 32 mmol/L   Glucose, Bld 107 (H) 70 - 99 mg/dL   BUN 12 6 - 20 mg/dL   Creatinine, Ser 4.09 0.44 - 1.00 mg/dL   Calcium 8.6 (L) 8.9 - 10.3 mg/dL   GFR calc non Af Amer >60 >60 mL/min   GFR calc Af Amer >60 >60 mL/min   Anion gap 5 5 - 15    2305:  Pt initially orthostatic on VS; IVF bolus given with improvement. H/H and BP within pt's baseline per Epic chart review. Pt refuses pelvic exam. Pt states she feels better and wants to go home now. Dx and testing d/w pt and family.  Questions answered.  Verb understanding, agreeable to d/c home with outpt f/u.   Final Clinical Impressions(s) / ED Diagnoses   Final diagnoses:  None    ED Discharge Orders    None       Samuel Jester, DO 02/23/18 2148

## 2018-03-01 ENCOUNTER — Ambulatory Visit (INDEPENDENT_AMBULATORY_CARE_PROVIDER_SITE_OTHER): Payer: Self-pay | Admitting: Otolaryngology

## 2018-09-13 ENCOUNTER — Emergency Department (HOSPITAL_COMMUNITY): Payer: 59

## 2018-09-13 ENCOUNTER — Encounter (HOSPITAL_COMMUNITY): Payer: Self-pay | Admitting: Emergency Medicine

## 2018-09-13 ENCOUNTER — Emergency Department (HOSPITAL_COMMUNITY)
Admission: EM | Admit: 2018-09-13 | Discharge: 2018-09-13 | Disposition: A | Discharge status: Home / Self Care | Payer: 59 | Attending: Emergency Medicine | Admitting: Emergency Medicine

## 2018-09-13 ENCOUNTER — Other Ambulatory Visit: Payer: Self-pay

## 2018-09-13 DIAGNOSIS — R0602 Shortness of breath: Secondary | ICD-10-CM | POA: Diagnosis not present

## 2018-09-13 DIAGNOSIS — F17210 Nicotine dependence, cigarettes, uncomplicated: Secondary | ICD-10-CM | POA: Insufficient documentation

## 2018-09-13 MED ORDER — KETOROLAC TROMETHAMINE 60 MG/2ML IM SOLN
60.0000 mg | Freq: Once | INTRAMUSCULAR | Status: AC
  Administered 2018-09-13: 60 mg via INTRAMUSCULAR
  Filled 2018-09-13: qty 2

## 2018-09-13 MED ORDER — PREDNISONE 20 MG PO TABS: ORAL_TABLET | ORAL | 0 refills | Status: DC

## 2018-09-13 MED ORDER — PREDNISONE 50 MG PO TABS
60.0000 mg | ORAL_TABLET | Freq: Once | ORAL | Status: AC
  Administered 2018-09-13: 60 mg via ORAL
  Filled 2018-09-13: qty 1

## 2018-09-13 MED ORDER — ALBUTEROL SULFATE HFA 108 (90 BASE) MCG/ACT IN AERS
2.0000 | INHALATION_SPRAY | Freq: Once | RESPIRATORY_TRACT | Status: AC
  Administered 2018-09-13: 2 via RESPIRATORY_TRACT
  Filled 2018-09-13: qty 13.4

## 2018-09-13 NOTE — ED Triage Notes (Signed)
Pt c/o cough x 1 week with chest pain with inhalation and SOB starting last night, denies fever, denies travel and denies possible COVID exposure, pt reports no other s/s

## 2018-09-13 NOTE — ED Provider Notes (Signed)
Emergency Department Provider Note   I have reviewed the triage vital signs and the nursing notes.   HISTORY  Chief Complaint Shortness of Breath   HPI Katelyn Hill Katelyn Hill is a 38 y.o. female smoker without significant past medical history who presents to the emergency department today secondary to chest pain or shortness of breath.  Patient states that she had a dry nonproductive cough for the last week or so.  This is not really changed much.  She denied a fever had any sick contacts that she knows of.  She has been exposed with COVID.  No recent travels or surgeries.  Patient states that over the last 24 hours she was started to have some peristernal chest discomfort worse when she coughs and takes a deep breath that feels like a muscle pain.  She states her cough has not changed with this but it worried that she might have pneumonia so she presents here for further evaluation.   No other associated or modifying symptoms.    Past Medical History:  Diagnosis Date  . Chronic pain   . Menorrhagia   . Migraine     Patient Active Problem List   Diagnosis Date Noted  . Smoker 08/19/2014  . Female pelvic pain 08/19/2014  . Family history of breast cancer in mother 08/19/2014    Past Surgical History:  Procedure Laterality Date  . CERVICAL SPINE SURGERY    . CESAREAN SECTION    . HEAD & NECK WOUND REPAIR / CLOSURE    . TUBAL LIGATION        Allergies Patient has no known allergies.  Family History  Problem Relation Age of Onset  . Cancer Mother 6336       Breast  . Cancer Paternal Grandmother   . Cancer Paternal Grandfather   . Cancer Maternal Aunt        breast    Social History Social History   Tobacco Use  . Smoking status: Current Every Day Smoker    Packs/day: 1.00    Years: 19.00    Pack years: 19.00    Types: Cigarettes  . Smokeless tobacco: Never Used  Substance Use Topics  . Alcohol use: Yes    Comment: occas  . Drug use: No    Review of  Systems  All other systems negative except as documented in the HPI. All pertinent positives and negatives as reviewed in the HPI. ____________________________________________   PHYSICAL EXAM:  VITAL SIGNS: Vitals:   09/13/18 0045 09/13/18 0100 09/13/18 0115 09/13/18 0132  BP:    126/66  Pulse:    90  Resp: (!) 23 15 15 17   Temp:    98.1 F (36.7 C)  TempSrc:    Oral  SpO2:    100%  Weight:      Height:         Constitutional: Alert and oriented. Well appearing and in no acute distress. Eyes: Conjunctivae are normal. PERRL. EOMI. Head: Atraumatic. Nose: No congestion/rhinnorhea. Mouth/Throat: Mucous membranes are moist.  Oropharynx non-erythematous. Neck: No stridor.  No meningeal signs.   Cardiovascular: Normal rate, regular rhythm. Good peripheral circulation. Grossly normal heart sounds.   Respiratory: Normal respiratory effort.  No retractions. Lungs diminished. Gastrointestinal: Soft and nontender. No distention.  Musculoskeletal: No lower extremity tenderness nor edema. No gross deformities of extremities. Neurologic:  Normal speech and language. No gross focal neurologic deficits are appreciated.  Skin:  Skin is warm, dry and intact. No rash noted.  ____________________________________________   LABS (all labs ordered are listed, but only abnormal results are displayed)  Labs Reviewed - No data to display ____________________________________________  EKG   EKG Interpretation  Date/Time:  Thursday September 13 2018 00:32:46 EDT Ventricular Rate:  95 PR Interval:    QRS Duration: 93 QT Interval:  350 QTC Calculation: 440 R Axis:   91 Text Interpretation:  Sinus rhythm Borderline right axis deviation No acute changes Confirmed by Katelyn Hill [phone]) on 09/13/2018 12:54:49 AM       ____________________________________________  RADIOLOGY  Dg Chest Portable 1 View  Result Date: 09/13/2018 CLINICAL DATA:  Cough and chest pain for 1 week EXAM: PORTABLE  CHEST 1 VIEW COMPARISON:  08/14/2008 FINDINGS: Cardiac shadows within normal limits. The lungs are well aerated bilaterally without focal infiltrate. No acute bony abnormality is noted. Cervical spine fusion is again seen IMPRESSION: No acute abnormality noted. Electronically Signed   By: Alcide Clever M.D.   On: 09/13/2018 01:12    ____________________________________________   PROCEDURES  Procedure(s) performed:   Procedures   ____________________________________________   INITIAL IMPRESSION / ASSESSMENT AND PLAN / ED COURSE  [place] Katelyn Ventresca was evaluated in Emergency Department on 09/13/2018 for the symptoms described in the history of present illness. She was evaluated in the context of the global COVID-19 pandemic, which necessitated consideration that the patient might be at risk for infection with the SARS-CoV-2 virus that causes COVID-19. Institutional protocols and algorithms that pertain to the evaluation of patients at risk for COVID-19 are in a state of rapid change based on information released by regulatory bodies including the CDC and federal and state organizations. These policies and algorithms were followed during the patient's care in the ED.   Likely bronchitis.  No respiratory distress.  No evidence of sepsis.  Low suspicion for pulmonary embolus or occult pneumonia at this time.  Will discharge on medications for bronchitis.   Pertinent labs & imaging results that were available during my care of the patient were reviewed by me and considered in my medical decision making (see chart for details).  A medical screening exam was performed and I feel the patient has had an appropriate workup for their chief complaint at this time and likelihood of emergent condition existing is low. They have been counseled on decision, discharge, follow up and which symptoms necessitate immediate return to the emergency department. They or their family verbally stated understanding and  agreement with plan and discharged in stable condition.   ____________________________________________  FINAL CLINICAL IMPRESSION(S) / ED DIAGNOSES  Final diagnoses:  Shortness of breath     MEDICATIONS GIVEN DURING THIS VISIT:  Medications  albuterol (PROVENTIL HFA;VENTOLIN HFA) 108 (90 Base) MCG/ACT inhaler 2 puff (2 puffs Inhalation Given 09/13/18 0041)  predniSONE (DELTASONE) tablet 60 mg (60 mg Oral Given 09/13/18 0138)  ketorolac (TORADOL) injection 60 mg (60 mg Intramuscular Given 09/13/18 0138)     NEW OUTPATIENT MEDICATIONS STARTED DURING THIS VISIT:  Discharge Medication List as of 09/13/2018  1:53 AM    START taking these medications   Details  predniSONE (DELTASONE) 20 MG tablet 2 tabs po daily x 4 days, Print        Note:  This note was prepared with assistance of Dragon voice recognition software. Occasional wrong-word or sound-a-like substitutions may have occurred due to the inherent limitations of voice recognition software.   Keeleigh Terris, Barbara Cower, MD 09/13/18 [phone]

## 2019-03-26 ENCOUNTER — Other Ambulatory Visit: Payer: Self-pay

## 2019-03-26 DIAGNOSIS — Z20822 Contact with and (suspected) exposure to covid-19: Secondary | ICD-10-CM

## 2019-03-27 LAB — NOVEL CORONAVIRUS, NAA: SARS-CoV-2, NAA: NOT DETECTED

## 2019-03-28 ENCOUNTER — Telehealth: Payer: Self-pay | Admitting: General Practice

## 2019-03-28 NOTE — Telephone Encounter (Signed)
Negative COVID results given. Patient results "NOT Detected." Caller expressed understanding. ° °

## 2019-04-01 ENCOUNTER — Emergency Department (HOSPITAL_COMMUNITY): Payer: 59

## 2019-04-01 ENCOUNTER — Other Ambulatory Visit: Payer: Self-pay

## 2019-04-01 ENCOUNTER — Emergency Department (HOSPITAL_COMMUNITY)
Admission: EM | Admit: 2019-04-01 | Discharge: 2019-04-01 | Disposition: A | Discharge status: Home / Self Care | Payer: 59 | Attending: Emergency Medicine | Admitting: Emergency Medicine

## 2019-04-01 ENCOUNTER — Encounter (HOSPITAL_COMMUNITY): Payer: Self-pay | Admitting: Emergency Medicine

## 2019-04-01 DIAGNOSIS — R05 Cough: Secondary | ICD-10-CM | POA: Diagnosis present

## 2019-04-01 DIAGNOSIS — Z720 Tobacco use: Secondary | ICD-10-CM

## 2019-04-01 DIAGNOSIS — F17210 Nicotine dependence, cigarettes, uncomplicated: Secondary | ICD-10-CM | POA: Insufficient documentation

## 2019-04-01 DIAGNOSIS — R062 Wheezing: Secondary | ICD-10-CM | POA: Diagnosis not present

## 2019-04-01 DIAGNOSIS — J40 Bronchitis, not specified as acute or chronic: Secondary | ICD-10-CM | POA: Diagnosis not present

## 2019-04-01 MED ORDER — BENZONATATE 100 MG PO CAPS: 200.0000 mg | ORAL_CAPSULE | Freq: Two times a day (BID) | ORAL | 0 refills | Status: DC | PRN

## 2019-04-01 MED ORDER — METHYLPREDNISOLONE 4 MG PO TBPK: ORAL_TABLET | ORAL | 0 refills | Status: DC

## 2019-04-01 MED ORDER — ALBUTEROL SULFATE HFA 108 (90 BASE) MCG/ACT IN AERS
2.0000 | INHALATION_SPRAY | Freq: Once | RESPIRATORY_TRACT | Status: AC
  Administered 2019-04-01: 2 via RESPIRATORY_TRACT
  Filled 2019-04-01: qty 6.7

## 2019-04-01 MED ORDER — AEROCHAMBER PLUS FLO-VU MISC
1.0000 | Freq: Once | Status: AC
  Administered 2019-04-01: 1
  Filled 2019-04-01: qty 1

## 2019-04-01 MED ORDER — DEXAMETHASONE SODIUM PHOSPHATE 10 MG/ML IJ SOLN
10.0000 mg | Freq: Once | INTRAMUSCULAR | Status: AC
  Administered 2019-04-01: 10 mg via INTRAMUSCULAR
  Filled 2019-04-01: qty 1

## 2019-04-01 NOTE — ED Triage Notes (Signed)
Pt states she has had dry cough x 2 weeks.

## 2019-04-01 NOTE — Discharge Instructions (Signed)
Use 2 puffs of your albuterol every 4 hours. You appear to have an upper respiratory infection (URI). An upper respiratory tract infection, or cold, is a viral infection of the air passages leading to the lungs. It is contagious and can be spread to others, especially during the first 3 or 4 days. It cannot be cured by antibiotics or other medicines. RETURN IMMEDIATELY IF you develop shortness of breath, confusion or altered mental status, a new rash, become dizzy, faint, or poorly responsive, or are unable to be cared for at home.

## 2019-04-01 NOTE — ED Provider Notes (Addendum)
[facility] EMERGENCY DEPARTMENT Provider Note   CSN: [id number] Arrival date & time: 04/01/19  2024     History   Chief Complaint Chief Complaint  Patient presents with  . Cough    HPI Katelyn Hill is a 38 y.o. female.  Presents emergency chief complaint dry cough.  Patient states that she had URI symptoms for the past 2 weeks.  She had a Covid test that was -1-week ago but states that her job will not let her return so she has another one that she has discontinued cough.  Patient was planning on going to the drive up Covid test tomorrow.  She complains of constant cough, feeling very tight.  She gets a tickle in her chest that has episodes of severe coughing.  She feels like she may have some wheezing with her cough but has no previous history of asthma.  She does smoke daily.  She denies fever or chills.  She has been taking Robitussin and guaifenesin with only minimal relief of her symptoms.  Her symptoms seem to be worse after exertion, at night and when she is in very warm room.  Yes     HPI  Past Medical History:  Diagnosis Date  . Chronic pain   . Menorrhagia   . Migraine     Patient Active Problem List   Diagnosis Date Noted  . Smoker 08/19/2014  . Female pelvic pain 08/19/2014  . Family history of breast cancer in mother 08/19/2014    Past Surgical History:  Procedure Laterality Date  . CERVICAL SPINE SURGERY    . CESAREAN SECTION    . HEAD & NECK WOUND REPAIR / CLOSURE    . TUBAL LIGATION       OB History    Gravida  4   Para  4   Term  4   Preterm      AB      Living  4     SAB      TAB      Ectopic      Multiple      Live Births               Home Medications    Prior to Admission medications   Medication Sig Start Date End Date Taking? Authorizing Provider  predniSONE (DELTASONE) 20 MG tablet 2 tabs po daily x 4 days 09/13/18   Mesner, Barbara Cower, MD    Family History Family History  Problem Relation Age of Onset  .  Cancer Mother 86       Breast  . Cancer Paternal Grandmother   . Cancer Paternal Grandfather   . Cancer Maternal Aunt        breast    Social History Social History   Tobacco Use  . Smoking status: Current Every Day Smoker    Packs/day: 1.00    Years: 19.00    Pack years: 19.00    Types: Cigarettes  . Smokeless tobacco: Never Used  Substance Use Topics  . Alcohol use: Yes    Comment: occas  . Drug use: No     Allergies   Patient has no known allergies.   Review of Systems Review of Systems Ten systems reviewed and are negative for acute change, except as noted in the HPI.    Physical Exam Updated Vital Signs BP 125/73   Pulse 96   Temp 98.5 F (36.9 C)   Resp 18   Ht 5\' 1"  (1.549  m)   Wt 95.3 kg   SpO2 99%   BMI 39.68 kg/m   Physical Exam Vitals signs and nursing note reviewed.  Constitutional:      General: She is not in acute distress.    Appearance: She is well-developed. She is not diaphoretic.  HENT:     Head: Normocephalic and atraumatic.  Eyes:     General: No scleral icterus.    Conjunctiva/sclera: Conjunctivae normal.  Neck:     Musculoskeletal: Normal range of motion.  Cardiovascular:     Rate and Rhythm: Normal rate and regular rhythm.     Heart sounds: Normal heart sounds. No murmur. No friction rub. No gallop.   Pulmonary:     Effort: Pulmonary effort is normal. No respiratory distress.     Breath sounds: Wheezing present.  Abdominal:     General: Bowel sounds are normal. There is no distension.     Palpations: Abdomen is soft. There is no mass.     Tenderness: There is no abdominal tenderness. There is no guarding.  Skin:    General: Skin is warm and dry.  Neurological:     Mental Status: She is alert and oriented to person, place, and time.  Psychiatric:        Behavior: Behavior normal.      ED Treatments / Results  Labs (all labs ordered are listed, but only abnormal results are displayed) Labs Reviewed - No data to  display  EKG None  Radiology No results found.  Procedures Procedures (including critical care time)  Medications Ordered in ED Medications - No data to display   Initial Impression / Assessment and Plan / ED Course  I have reviewed the triage vital signs and the nursing notes.  Pertinent labs & imaging results that were available during my care of the patient were reviewed by me and considered in my medical decision making (see chart for details).       Patient here with symptoms of URI.  I personally reviewed the patient's 2 view chest x-ray which shows no evidence of acute infiltrate or edema on my interpretation.  Patient appears to have bronchitis with reactive airway.  Given a shot of Decadron.  Discharged with albuterol 2 puffs every 4 hours using a spacer, Medrol Dosepak and Tessalon Perles.  Patient appears appropriate for discharge.  Discussed return precautions.The patient was counseled on the dangers of tobacco use, and was advised to quit. Reviewed strategies to maximize success, including removing cigarettes and smoking materials from environment, stress management, substitution of other forms of reinforcement, support of family/friends and written materials.   Final Clinical Impressions(s) / ED Diagnoses   Final diagnoses:  Bronchitis with acute wheezing  Tobacco abuse    ED Discharge Orders    None       Margarita Mail, PA-C 04/01/19 2213    Noemi Chapel, MD 04/02/19 [place], [place], PA-C 04/11/19 1715    Noemi Chapel, MD 04/12/19 [phone]

## 2019-04-02 ENCOUNTER — Other Ambulatory Visit: Payer: Self-pay | Admitting: *Deleted

## 2019-04-02 DIAGNOSIS — Z20822 Contact with and (suspected) exposure to covid-19: Secondary | ICD-10-CM

## 2019-04-03 LAB — NOVEL CORONAVIRUS, NAA: SARS-CoV-2, NAA: NOT DETECTED

## 2019-07-26 ENCOUNTER — Other Ambulatory Visit: Payer: Self-pay

## 2019-07-26 ENCOUNTER — Emergency Department (HOSPITAL_COMMUNITY)
Admission: EM | Admit: 2019-07-26 | Discharge: 2019-07-26 | Disposition: A | Discharge status: Home / Self Care | Payer: 59 | Attending: Emergency Medicine | Admitting: Emergency Medicine

## 2019-07-26 ENCOUNTER — Encounter (HOSPITAL_COMMUNITY): Payer: Self-pay | Admitting: *Deleted

## 2019-07-26 DIAGNOSIS — K0889 Other specified disorders of teeth and supporting structures: Secondary | ICD-10-CM | POA: Insufficient documentation

## 2019-07-26 DIAGNOSIS — K029 Dental caries, unspecified: Secondary | ICD-10-CM | POA: Insufficient documentation

## 2019-07-26 DIAGNOSIS — F17210 Nicotine dependence, cigarettes, uncomplicated: Secondary | ICD-10-CM | POA: Diagnosis not present

## 2019-07-26 MED ORDER — ACETAMINOPHEN 325 MG PO TABS
650.0000 mg | ORAL_TABLET | Freq: Once | ORAL | Status: DC
  Filled 2019-07-26: qty 2

## 2019-07-26 MED ORDER — PENICILLIN V POTASSIUM 500 MG PO TABS: 500.0000 mg | ORAL_TABLET | Freq: Four times a day (QID) | ORAL | 0 refills | Status: AC

## 2019-07-26 MED ORDER — PENICILLIN V POTASSIUM 250 MG PO TABS
500.0000 mg | ORAL_TABLET | Freq: Once | ORAL | Status: AC
  Administered 2019-07-26: 500 mg via ORAL
  Filled 2019-07-26: qty 2

## 2019-07-26 MED ORDER — CHLORHEXIDINE GLUCONATE 0.12 % MT SOLN: 15.0000 mL | Freq: Two times a day (BID) | OROMUCOSAL | 0 refills | Status: DC

## 2019-07-26 NOTE — ED Triage Notes (Signed)
Pt with dental pain for past 3 days on right lower

## 2019-07-26 NOTE — ED Notes (Signed)
Pt reports when offered tylenol that she has an allergy to same as it gives her a migraine  She also reports she cannot take vicodin as it makes her have a headache but does not have the same response to "heavy" pain meds   Reports she can take percocet

## 2019-07-26 NOTE — Discharge Instructions (Addendum)
You have been diagnosed today with dental pain.  At this time there does not appear to be the presence of an emergent medical condition, however there is always the potential for conditions to change. Please read and follow the below instructions.  Please return to the Emergency Department immediately for any new or worsening symptoms. Please be sure to follow up with your Primary Care Provider within one week regarding your visit today; please call their office to schedule an appointment even if you are feeling better for a follow-up visit. Please take the antibiotic penicillin as prescribed to help with your symptoms.  Patient plenty of water and get plenty of rest.  Please call the dentist Dr. Mia Creek on your discharge paperwork to schedule a follow-up appointment for definitive dental care. Please take Ibuprofen (Advil, motrin) and Tylenol (acetaminophen) to relieve your pain.  You may take up to 400 MG (2 pills) of normal strength ibuprofen every 8 hours as needed.  In between doses of ibuprofen you make take tylenol, up to 500 mg (one extra strength pill).  Do not take more than 3,000 mg tylenol in a 24 hour period.  Please check all medication labels as many medications such as pain and cold medications may contain tylenol.  Do not drink alcohol while taking these medications.  Do not take other NSAID'S while taking ibuprofen (such as aleve or naproxen).  Please take ibuprofen with food to decrease stomach upset. You may use the Peridex mouth rinse to help with your symptoms.  Rinse and spit this medication out.  Do not swallow Peridex.  Get help right away if: You cannot open your mouth. You are having trouble breathing or swallowing. You have a fever. Your face, neck, or jaw is swollen. You have any new/concerning or worsening of symptoms  Please read the additional information packets attached to your discharge summary.  Do not take your medicine if  develop an itchy rash, swelling in  your mouth or lips, or difficulty breathing; call 911 and seek immediate emergency medical attention if this occurs.  Note: Portions of this text may have been transcribed using voice recognition software. Every effort was made to ensure accuracy; however, inadvertent computerized transcription errors may still be present.

## 2019-07-26 NOTE — ED Provider Notes (Addendum)
[facility] EMERGENCY DEPARTMENT Provider Note   CSN: [id number] Arrival date & time: 07/26/19  1930     History Chief Complaint  Patient presents with  . Dental Pain    Katelyn Hill is a 39 y.o. female without history of immunocompromising disease presents today for right lower dental pain.  Onset 3 days ago moderate density throbbing constant worsened with chewing on that side minimally improved with ibuprofen.  She reports similar pain in the past with previous dental infections.  She reports that she does not have a dentist to follow-up with but has recently changed jobs and will have dental insurance in the coming month.  Denies fever/chills, headache, trismus, vision changes, sore throat, swelling of the face/head/neck, nausea/vomiting or any additional concerns.  HPI     Past Medical History:  Diagnosis Date  . Chronic pain   . Menorrhagia   . Migraine     Patient Active Problem List   Diagnosis Date Noted  . Smoker 08/19/2014  . Female pelvic pain 08/19/2014  . Family history of breast cancer in mother 08/19/2014    Past Surgical History:  Procedure Laterality Date  . CERVICAL SPINE SURGERY    . CESAREAN SECTION    . HEAD & NECK WOUND REPAIR / CLOSURE    . TUBAL LIGATION       OB History    Gravida  4   Para  4   Term  4   Preterm      AB      Living  4     SAB      TAB      Ectopic      Multiple      Live Births              Family History  Problem Relation Age of Onset  . Cancer Mother 8       Breast  . Cancer Paternal Grandmother   . Cancer Paternal Grandfather   . Cancer Maternal Aunt        breast    Social History   Tobacco Use  . Smoking status: Current Every Day Smoker    Packs/day: 1.00    Years: 19.00    Pack years: 19.00    Types: Cigarettes  . Smokeless tobacco: Never Used  Substance Use Topics  . Alcohol use: Yes    Comment: occas  . Drug use: No    Home Medications Prior to Admission  medications   Medication Sig Start Date End Date Taking? Authorizing Provider  benzonatate (TESSALON) 100 MG capsule Take 2 capsules (200 mg total) by mouth 2 (two) times daily as needed for cough. 04/01/19   Arthor Captain, PA-C  chlorhexidine (PERIDEX) 0.12 % solution Use as directed 15 mLs in the mouth or throat 2 (two) times daily. 07/26/19   Bill Salinas, PA-C  methylPREDNISolone (MEDROL DOSEPAK) 4 MG TBPK tablet Use as directed 04/03/19   Arthor Captain, PA-C  penicillin v potassium (VEETID) 500 MG tablet Take 1 tablet (500 mg total) by mouth 4 (four) times daily for 7 days. 07/26/19 08/02/19  Harlene Salts A, PA-C  predniSONE (DELTASONE) 20 MG tablet 2 tabs po daily x 4 days 09/13/18   Mesner, Barbara Cower, MD    Allergies    Patient has no known allergies.  Review of Systems   Review of Systems  Constitutional: Negative.  Negative for chills and fever.  HENT: Positive for dental problem. Negative for facial swelling,  sore throat and trouble swallowing.   Gastrointestinal: Negative.  Negative for nausea and vomiting.    Physical Exam Updated Vital Signs BP (!) 117/51 (BP Location: Left Arm)   Pulse 84   Temp 98.4 F (36.9 C) (Oral)   Resp 16   Ht 5\' 1"  (1.549 m)   Wt 97.5 kg   LMP 07/19/2019   SpO2 100%   BMI 40.62 kg/m   Physical Exam Constitutional:      General: She is not in acute distress.    Appearance: Normal appearance. She is well-developed. She is not ill-appearing or diaphoretic.  HENT:     Head: Normocephalic and atraumatic.     Jaw: There is normal jaw occlusion. No trismus.     Right Ear: Tympanic membrane and external ear normal.     Left Ear: Tympanic membrane and external ear normal.     Nose: Nose normal.     Right Sinus: No maxillary sinus tenderness or frontal sinus tenderness.     Left Sinus: No maxillary sinus tenderness or frontal sinus tenderness.     Mouth/Throat:     Comments: Tenderness to percussion of tooth #28.  Multiple dental caries  and poor dentition overall, multiple missing teeth.  The patient has normal phonation and is in control of secretions. No stridor.  Midline uvula without edema. Soft palate rises symmetrically.  No tonsillar erythema, swelling or exudates. Tongue protrusion is normal, floor of mouth is soft. No trismus. No creptius on neck palpation. No gingival erythema or fluctuance noted. Mucus membranes moist. Eyes:     General: Vision grossly intact. Gaze aligned appropriately.     Extraocular Movements: Extraocular movements intact.     Conjunctiva/sclera: Conjunctivae normal.     Pupils: Pupils are equal, round, and reactive to light.  Neck:     Trachea: Trachea and phonation normal. No tracheal tenderness or tracheal deviation.  Pulmonary:     Effort: Pulmonary effort is normal. No respiratory distress.  Abdominal:     General: There is no distension.     Palpations: Abdomen is soft.     Tenderness: There is no abdominal tenderness. There is no guarding or rebound.  Musculoskeletal:        General: Normal range of motion.     Cervical back: Full passive range of motion without pain, normal range of motion and neck supple. No edema, erythema or rigidity.  Skin:    General: Skin is warm and dry.  Neurological:     Mental Status: She is alert.     GCS: GCS eye subscore is 4. GCS verbal subscore is 5. GCS motor subscore is 6.     Comments: Speech is clear and goal oriented, follows commands Major Cranial nerves without deficit, no facial droop Moves extremities without ataxia, coordination intact  Psychiatric:        Behavior: Behavior normal.     ED Results / Procedures / Treatments   Labs (all labs ordered are listed, but only abnormal results are displayed) Labs Reviewed - No data to display  EKG None  Radiology No results found.  Procedures Procedures (including critical care time)  Medications Ordered in ED Medications  acetaminophen (TYLENOL) tablet 650 mg (has no  administration in time range)  penicillin v potassium (VEETID) tablet 500 mg (has no administration in time range)    ED Course  I have reviewed the triage vital signs and the nursing notes.  Pertinent labs & imaging results that were available during  my care of the patient were reviewed by me and considered in my medical decision making (see chart for details).    MDM Rules/Calculators/A&P                      72-year-old female with a history of immunocompromising diseases presents today for right lower dental pain onset 3 days ago.  Poor dentition overall, multiple infected dental surface cavities and broken teeth.  Pain to percussion of tooth #28.  No signs or symptoms of dental abscess, no swelling/erythema/tenderness of the gums.  Patient is well-appearing, afebrile, nontoxic, speaking well.  Patient able to swallow without pain.  No signs of swelling or concern for Ludwig's angina/Peritonsilar abscess/Retropharyngeal abscess or other deep tissue infections.  No sign of swelling of the neck, patient has good range of motion of the neck, no trismus.  Will treat with penicillin VK 500 mg 4 times daily x7 days.  OTC anti-inflammatories.  Heat/ice therapy.  Will give referral to on-call dentist for definitive dental care.    Of note patient denies chance of pregnancy today, she reports that she has had tubal ligations.  Patient states understanding that medications given or prescribed today may result in harm to of a pregnancy and she accepts these risks and still chooses not to be pregnancy tested and proceed with medications.  At this time there does not appear to be any evidence of an acute emergency medical condition and the patient appears stable for discharge with appropriate outpatient follow up. Diagnosis was discussed with patient who verbalizes understanding of care plan and is agreeable to discharge. I have discussed return precautions with patient who verbalizes understanding of return  precautions. Patient encouraged to follow-up with their PCP and dentist. All questions answered.  Note: Portions of this report may have been transcribed using voice recognition software. Every effort was made to ensure accuracy; however, inadvertent computerized transcription errors may still be present. Final Clinical Impression(s) / ED Diagnoses Final diagnoses:  Pain due to dental caries    Rx / DC Orders ED Discharge Orders         Ordered    penicillin v potassium (VEETID) 500 MG tablet  4 times daily     07/26/19 2014    chlorhexidine (PERIDEX) 0.12 % solution  2 times daily     07/26/19 2014           Elizabeth Palau 07/26/19 2015    Bill Salinas, PA-C 07/26/19 2019    Eber Hong, MD 07/27/19 [phone]

## 2019-07-26 NOTE — ED Notes (Signed)
Pt reports poor dental care for awhile  No money to see dentist  Has dentist and new job but waiting for her benefits "to kick in"  Reports many teeth bad but the worst is bottom R molar with large caries

## 2019-08-09 ENCOUNTER — Emergency Department (HOSPITAL_COMMUNITY)
Admission: EM | Admit: 2019-08-09 | Discharge: 2019-08-09 | Disposition: A | Discharge status: Home / Self Care | Payer: 59 | Attending: Emergency Medicine | Admitting: Emergency Medicine

## 2019-08-09 ENCOUNTER — Encounter (HOSPITAL_COMMUNITY): Payer: Self-pay | Admitting: Emergency Medicine

## 2019-08-09 ENCOUNTER — Other Ambulatory Visit: Payer: Self-pay

## 2019-08-09 DIAGNOSIS — F17210 Nicotine dependence, cigarettes, uncomplicated: Secondary | ICD-10-CM | POA: Insufficient documentation

## 2019-08-09 DIAGNOSIS — K0889 Other specified disorders of teeth and supporting structures: Secondary | ICD-10-CM

## 2019-08-09 MED ORDER — HYDROCODONE-ACETAMINOPHEN 5-325 MG PO TABS: 1.0000 | ORAL_TABLET | ORAL | 0 refills | Status: DC | PRN

## 2019-08-09 NOTE — Discharge Instructions (Addendum)
Continue taking your antibiotics as directed.  Follow-up with your dentist next week as previously scheduled.

## 2019-08-09 NOTE — ED Provider Notes (Signed)
[facility] EMERGENCY DEPARTMENT Provider Note   CSN: [id number] Arrival date & time: 08/09/19  2007     History Chief Complaint  Patient presents with  . Dental Pain    [name] is a 39 y.o. female.  HPI      Katelyn Hill is a 39 y.o. female who presents to the Emergency Department complaining of dental pain.  Pain has been gradually worsening for several days.  She was seen here 2 weeks ago for similar symptoms.  She is currently taking penicillin, but continues to have throbbing pain to several carious teeth.  She is scheduled for oral surgery on 08/15/2019.  She has been taking over-the-counter pain relievers without improvement.  She states that she is having difficulty eating solid foods due to the pain on both sides of her mouth.  She denies difficulty swallowing, shortness of breath, facial swelling, fever or chills.  No neck pain or swelling.   Past Medical History:  Diagnosis Date  . Chronic pain   . Menorrhagia   . Migraine     Patient Active Problem List   Diagnosis Date Noted  . Smoker 08/19/2014  . Female pelvic pain 08/19/2014  . Family history of breast cancer in mother 08/19/2014    Past Surgical History:  Procedure Laterality Date  . CERVICAL SPINE SURGERY    . CESAREAN SECTION    . HEAD & NECK WOUND REPAIR / CLOSURE    . TUBAL LIGATION       OB History    Gravida  4   Para  4   Term  4   Preterm      AB      Living  4     SAB      TAB      Ectopic      Multiple      Live Births              Family History  Problem Relation Age of Onset  . Cancer Mother 62       Breast  . Cancer Paternal Grandmother   . Cancer Paternal Grandfather   . Cancer Maternal Aunt        breast    Social History   Tobacco Use  . Smoking status: Current Every Day Smoker    Packs/day: 1.00    Years: 19.00    Pack years: 19.00    Types: Cigarettes  . Smokeless tobacco: Never Used  Substance Use Topics  . Alcohol use: Yes      Comment: occas  . Drug use: No    Home Medications Prior to Admission medications   Medication Sig Start Date End Date Taking? Authorizing Provider  benzonatate (TESSALON) 100 MG capsule Take 2 capsules (200 mg total) by mouth 2 (two) times daily as needed for cough. 04/01/19   Margarita Mail, PA-C  chlorhexidine (PERIDEX) 0.12 % solution Use as directed 15 mLs in the mouth or throat 2 (two) times daily. 07/26/19   Deliah Boston, PA-C  methylPREDNISolone (MEDROL DOSEPAK) 4 MG TBPK tablet Use as directed 04/03/19   Margarita Mail, PA-C  predniSONE (DELTASONE) 20 MG tablet 2 tabs po daily x 4 days 09/13/18   Mesner, Corene Cornea, MD    Allergies    Tylenol [acetaminophen]  Review of Systems   Review of Systems  Constitutional: Negative for appetite change and fever.  HENT: Positive for dental problem. Negative for congestion, facial swelling, sore throat and trouble  swallowing.   Eyes: Negative for pain and visual disturbance.  Respiratory: Negative for cough, shortness of breath and wheezing.   Cardiovascular: Negative for chest pain.  Gastrointestinal: Negative for nausea and vomiting.  Musculoskeletal: Negative for neck pain and neck stiffness.  Neurological: Negative for dizziness, facial asymmetry and headaches.  Hematological: Negative for adenopathy.    Physical Exam Updated Vital Signs BP 128/67 (BP Location: Right Arm)   Pulse 82   Temp 98.4 F (36.9 C) (Oral)   Resp 18   Ht 5\' 1"  (1.549 m)   Wt 95.3 kg   LMP 08/09/2019   SpO2 100%   BMI 39.68 kg/m   Physical Exam Vitals and nursing note reviewed.  Constitutional:      General: She is not in acute distress.    Appearance: Normal appearance. She is well-developed. She is not toxic-appearing.  HENT:     Head: Normocephalic.     Jaw: No trismus.     Right Ear: Tympanic membrane and ear canal normal.     Left Ear: Tympanic membrane and ear canal normal.     Mouth/Throat:     Dentition: Dental tenderness and  dental caries present. No gingival swelling or dental abscesses.     Pharynx: Oropharynx is clear. Uvula midline. No pharyngeal swelling or uvula swelling.     Comments: Katelyn Hill has tenderness of multiple dental caries involving the right upper and lower molars, left upper and lower cuspids. No edema or fluctuance of the gums.  Uvula is midline and non-edematous.  No trismus.   Cardiovascular:     Rate and Rhythm: Normal rate and regular rhythm.     Heart sounds: Normal heart sounds. No murmur.  Pulmonary:     Effort: Pulmonary effort is normal.     Breath sounds: Normal breath sounds.  Musculoskeletal:        General: Normal range of motion.     Cervical back: Normal range of motion and neck supple.  Lymphadenopathy:     Cervical: No cervical adenopathy.  Skin:    General: Skin is warm and dry.  Neurological:     Mental Status: She is alert and oriented to person, place, and time.     Motor: No abnormal muscle tone.     Coordination: Coordination normal.     ED Results / Procedures / Treatments   Labs (all labs ordered are listed, but only abnormal results are displayed) Labs Reviewed - No data to display  EKG None  Radiology No results found.  Procedures Procedures (including critical care time)  Medications Ordered in ED Medications - No data to display  ED Course  I have reviewed the triage vital signs and the nursing notes.  Pertinent labs & imaging results that were available during my care of the patient were reviewed by me and considered in my medical decision making (see chart for details).    MDM Rules/Calculators/A&P                      Patient with multiple dental caries.  No obvious dental abscess at this time.  She is scheduled for dental extraction of several teeth on 08/15/2019.  She is currently taking antibiotics.  No concerning symptoms for Ludewig's angina.  Final Clinical Impression(s) / ED Diagnoses Final diagnoses:  Pain, dental    Rx / DC  Orders ED Discharge Orders    None       08/17/2019, PA-C 08/09/19 2217  Milagros Loll, MD 08/14/19 [phone]

## 2019-08-09 NOTE — ED Triage Notes (Signed)
Pt with dental pain for few weeks and it has made her throat sore. Seen dentist 2 weeks ago. Suppose to have oral surgery next week, but the pain has gotten worse.

## 2019-08-12 MED FILL — Hydrocodone-Acetaminophen Tab 5-325 MG: ORAL | Qty: 6 | Status: AC

## 2019-09-16 DIAGNOSIS — H900 Conductive hearing loss, bilateral: Secondary | ICD-10-CM | POA: Diagnosis not present

## 2019-09-16 DIAGNOSIS — H6123 Impacted cerumen, bilateral: Secondary | ICD-10-CM | POA: Diagnosis not present

## 2019-09-25 DIAGNOSIS — H6123 Impacted cerumen, bilateral: Secondary | ICD-10-CM | POA: Diagnosis not present

## 2019-09-25 DIAGNOSIS — H900 Conductive hearing loss, bilateral: Secondary | ICD-10-CM | POA: Diagnosis not present

## 2020-01-10 DIAGNOSIS — K1379 Other lesions of oral mucosa: Secondary | ICD-10-CM | POA: Diagnosis not present

## 2020-01-10 DIAGNOSIS — L409 Psoriasis, unspecified: Secondary | ICD-10-CM | POA: Diagnosis not present

## 2020-01-10 DIAGNOSIS — M272 Inflammatory conditions of jaws: Secondary | ICD-10-CM | POA: Diagnosis not present

## 2020-02-06 ENCOUNTER — Other Ambulatory Visit: Payer: Self-pay

## 2020-02-07 DIAGNOSIS — R519 Headache, unspecified: Secondary | ICD-10-CM | POA: Diagnosis not present

## 2020-02-07 DIAGNOSIS — Z03818 Encounter for observation for suspected exposure to other biological agents ruled out: Secondary | ICD-10-CM | POA: Diagnosis not present

## 2020-02-07 DIAGNOSIS — Z20822 Contact with and (suspected) exposure to covid-19: Secondary | ICD-10-CM | POA: Diagnosis not present

## 2020-02-07 DIAGNOSIS — R05 Cough: Secondary | ICD-10-CM | POA: Diagnosis not present

## 2020-03-11 ENCOUNTER — Emergency Department (HOSPITAL_COMMUNITY)
Admission: EM | Admit: 2020-03-11 | Discharge: 2020-03-11 | Disposition: A | Discharge status: Home / Self Care | Payer: Federal, State, Local not specified - PPO | Attending: Emergency Medicine | Admitting: Emergency Medicine

## 2020-03-11 ENCOUNTER — Other Ambulatory Visit: Payer: Self-pay

## 2020-03-11 ENCOUNTER — Encounter (HOSPITAL_COMMUNITY): Payer: Self-pay | Admitting: Emergency Medicine

## 2020-03-11 ENCOUNTER — Emergency Department (HOSPITAL_COMMUNITY): Payer: Federal, State, Local not specified - PPO

## 2020-03-11 DIAGNOSIS — F17210 Nicotine dependence, cigarettes, uncomplicated: Secondary | ICD-10-CM | POA: Insufficient documentation

## 2020-03-11 DIAGNOSIS — U071 COVID-19: Secondary | ICD-10-CM | POA: Diagnosis not present

## 2020-03-11 DIAGNOSIS — R058 Other specified cough: Secondary | ICD-10-CM | POA: Diagnosis not present

## 2020-03-11 DIAGNOSIS — R059 Cough, unspecified: Secondary | ICD-10-CM | POA: Diagnosis not present

## 2020-03-11 LAB — RESPIRATORY PANEL BY RT PCR (FLU A&B, COVID)
Influenza A by PCR: NEGATIVE
Influenza B by PCR: NEGATIVE
SARS Coronavirus 2 by RT PCR: POSITIVE — AB

## 2020-03-11 MED ORDER — ALBUTEROL SULFATE HFA 108 (90 BASE) MCG/ACT IN AERS
2.0000 | INHALATION_SPRAY | Freq: Once | RESPIRATORY_TRACT | Status: AC
  Administered 2020-03-11: 2 via RESPIRATORY_TRACT
  Filled 2020-03-11: qty 6.7

## 2020-03-11 MED ORDER — FAMOTIDINE IN NACL 20-0.9 MG/50ML-% IV SOLN: 20.0000 mg | Freq: Once | INTRAVENOUS | Status: DC | PRN

## 2020-03-11 MED ORDER — ALBUTEROL SULFATE HFA 108 (90 BASE) MCG/ACT IN AERS: 2.0000 | INHALATION_SPRAY | Freq: Once | RESPIRATORY_TRACT | Status: DC | PRN

## 2020-03-11 MED ORDER — EPINEPHRINE 0.3 MG/0.3ML IJ SOAJ: 0.3000 mg | Freq: Once | INTRAMUSCULAR | Status: DC | PRN

## 2020-03-11 MED ORDER — METHYLPREDNISOLONE SODIUM SUCC 125 MG IJ SOLR: 125.0000 mg | Freq: Once | INTRAMUSCULAR | Status: DC | PRN

## 2020-03-11 MED ORDER — SODIUM CHLORIDE 0.9 % IV SOLN
1200.0000 mg | Freq: Once | INTRAVENOUS | Status: AC
  Administered 2020-03-11: 1200 mg via INTRAVENOUS
  Filled 2020-03-11: qty 10

## 2020-03-11 MED ORDER — BENZONATATE 100 MG PO CAPS: 200.0000 mg | ORAL_CAPSULE | Freq: Three times a day (TID) | ORAL | 0 refills | Status: DC | PRN

## 2020-03-11 MED ORDER — DIPHENHYDRAMINE HCL 50 MG/ML IJ SOLN: 50.0000 mg | Freq: Once | INTRAMUSCULAR | Status: DC | PRN

## 2020-03-11 MED ORDER — BENZONATATE 100 MG PO CAPS
200.0000 mg | ORAL_CAPSULE | Freq: Once | ORAL | Status: AC
  Administered 2020-03-11: 200 mg via ORAL
  Filled 2020-03-11: qty 2

## 2020-03-11 MED ORDER — SODIUM CHLORIDE 0.9 % IV SOLN: INTRAVENOUS | Status: DC | PRN

## 2020-03-11 NOTE — ED Triage Notes (Signed)
Pt c/o cough and loss of smell and taste.

## 2020-03-11 NOTE — Discharge Instructions (Signed)
Use the inhaler as needed to help control the cough, and to improve your breathing.  Return to the emergency Department if you start having trouble breathing.

## 2020-03-11 NOTE — ED Provider Notes (Signed)
[facility] EMERGENCY DEPARTMENT Provider Note   CSN: [id number] Arrival date & time: 03/11/20  0242   History Chief Complaint  Patient presents with   Cough    Katelyn Hill is a 39 y.o. female.  The history is provided by the patient.  Cough She comes in with approximately 3-day history of nonproductive cough and feeling "wiped out".  She has lost her sense of smell and taste.  She denies fever, chills, sweats.  She denies nausea, vomiting, diarrhea.  She has had generalized body aches and she rates her pain at 8/10.  She has taken ibuprofen with temporary relief of achiness.  She has used albuterol inhaler which has not helped the cough.  She denies dyspnea.  She has been quarantined with her daughter for the last 10 days because daughter tested positive for COVID-19.  She has not been vaccinated against COVID-19.  Past Medical History:  Diagnosis Date   Chronic pain    Menorrhagia    Migraine     Patient Active Problem List   Diagnosis Date Noted   Smoker 08/19/2014   Female pelvic pain 08/19/2014   Family history of breast cancer in mother 08/19/2014    Past Surgical History:  Procedure Laterality Date   CERVICAL SPINE SURGERY     CESAREAN SECTION     HEAD & NECK WOUND REPAIR / CLOSURE     TUBAL LIGATION       OB History    Gravida  4   Para  4   Term  4   Preterm      AB      Living  4     SAB      TAB      Ectopic      Multiple      Live Births              Family History  Problem Relation Age of Onset   Cancer Mother 23       Breast   Cancer Paternal Grandmother    Cancer Paternal Grandfather    Cancer Maternal Aunt        breast    Social History   Tobacco Use   Smoking status: Current Every Day Smoker    Packs/day: 1.00    Years: 19.00    Pack years: 19.00    Types: Cigarettes   Smokeless tobacco: Never Used  Building services engineer Use: Never used  Substance Use Topics   Alcohol use: Yes     Comment: occas   Drug use: No    Home Medications Prior to Admission medications   Medication Sig Start Date End Date Taking? Authorizing Provider  benzonatate (TESSALON) 100 MG capsule Take 2 capsules (200 mg total) by mouth 2 (two) times daily as needed for cough. 04/01/19   Arthor Captain, PA-C  chlorhexidine (PERIDEX) 0.12 % solution Use as directed 15 mLs in the mouth or throat 2 (two) times daily. 07/26/19   Bill Salinas, PA-C  HYDROcodone-acetaminophen (NORCO/VICODIN) 5-325 MG tablet Take 1 tablet by mouth every 4 (four) hours as needed. 08/09/19   Triplett, Babette Relic, PA-C  methylPREDNISolone (MEDROL DOSEPAK) 4 MG TBPK tablet Use as directed 04/03/19   Arthor Captain, PA-C  predniSONE (DELTASONE) 20 MG tablet 2 tabs po daily x 4 days 09/13/18   Mesner, Barbara Cower, MD    Allergies    Tylenol [acetaminophen]  Review of Systems   Review of Systems  Respiratory: Positive  for cough.   All other systems reviewed and are negative.   Physical Exam Updated Vital Signs BP 130/81 (BP Location: Right Arm)    Pulse (!) 110    Temp 98.9 F (37.2 C) (Oral)    Resp 18    Ht 5\' 1"  (1.549 m)    Wt 99.8 kg    SpO2 100%    BMI 41.57 kg/m   Physical Exam Vitals and nursing note reviewed.   39 year old female, resting comfortably and in no acute distress. Vital signs are significant for rapid heart rate. Oxygen saturation is 100%, which is normal. Head is normocephalic and atraumatic. PERRLA, EOMI. Oropharynx is clear. Neck is nontender and supple without adenopathy or JVD. Back is nontender and there is no CVA tenderness. Lungs are clear without rales, wheezes, or rhonchi. Chest is nontender. Heart has regular rate and rhythm without murmur. Abdomen is soft, flat, nontender without masses or hepatosplenomegaly and peristalsis is normoactive. Extremities have no cyanosis or edema, full range of motion is present. Skin is warm and dry without rash. Neurologic: Mental status is normal, cranial  nerves are intact, there are no motor or sensory deficits.  ED Results / Procedures / Treatments   Labs (all labs ordered are listed, but only abnormal results are displayed) Labs Reviewed  RESPIRATORY PANEL BY RT PCR (FLU A&B, COVID) - Abnormal; Notable for the following components:      Result Value   SARS Coronavirus 2 by RT PCR POSITIVE (*)    All other components within normal limits   Radiology DG Chest Port 1 View  Result Date: 03/11/2020 CLINICAL DATA:  Cough, COVID PUI EXAM: PORTABLE CHEST 1 VIEW COMPARISON:  04/01/2019 FINDINGS: Multifocal airspace infiltrates have developed within the right upper and bilateral lower lung zone, likely infectious in the appropriate clinical setting. No pneumothorax or pleural effusion. Cardiac size within normal limits. The pulmonary vascularity is normal. IMPRESSION: Multifocal pulmonary infiltrates bilaterally, likely infectious or inflammatory in the acute setting. Electronically Signed   By: 13/06/2018 MD   On: 03/11/2020 03:59    Procedures Procedures   Medications Ordered in ED Medications  albuterol (VENTOLIN HFA) 108 (90 Base) MCG/ACT inhaler 2 puff (has no administration in time range)  benzonatate (TESSALON) capsule 200 mg (has no administration in time range)    ED Course  I have reviewed the triage vital signs and the nursing notes.  Pertinent labs & imaging results that were available during my care of the patient were reviewed by me and considered in my medical decision making (see chart for details).  MDM Rules/Calculators/A&P Respiratory tract infection strongly suggestive of COVID-19-especially considering known exposure and lack of vaccination.  Also consider possibility of pneumonia, influenza.  Will check COVID-19 PCR as well as influenza PCR.  Will check chest x-ray.  Oxygen saturation is excellent.  Patient would be a good candidate for monoclonal antibodies with risk factors of cigarette smoking and obesity, but  patient is reluctant to accept monoclonal antibody treatment.  Patient is asking about steroids, but steroids are not indicated and treatment of COVID-19 as an outpatient.  Old records are reviewed, and she does have prior ED visits for bronchitis.  Two tests for COVID-19 in October and November of last year were both negative.  COVID-19 PCR is positive.  Chest x-ray is read as multifocal pulmonary infiltrates.  I have reviewed the images and the changes are quite minimal.  However, she clearly is a candidate for monoclonal antibodies.  After further discussion with the patient, she is agreeable to get the monoclonal antibody treatment and infusion is ordered.  She did note that her cough is somewhat improved with benzonatate, and she will be given a prescription for that at home.  Katelyn Hill was evaluated in Emergency Department on 03/11/2020 for the symptoms described in the history of present illness. She was evaluated in the context of the global COVID-19 pandemic, which necessitated consideration that the patient might be at risk for infection with the SARS-CoV-2 virus that causes COVID-19. Institutional protocols and algorithms that pertain to the evaluation of patients at risk for COVID-19 are in a state of rapid change based on information released by regulatory bodies including the CDC and federal and state organizations. These policies and algorithms were followed during the patient's care in the ED.  Final Clinical Impression(s) / ED Diagnoses Final diagnoses:  COVID-19 virus infection    Rx / DC Orders ED Discharge Orders         Ordered    benzonatate (TESSALON) 100 MG capsule  3 times daily PRN        03/11/20 0446           Dione Booze, MD 03/11/20 [phone]

## 2020-03-11 NOTE — ED Notes (Signed)
Pt tolerated antibody infusion well. Vitals WDL. Will continue to monitor.

## 2020-03-11 NOTE — ED Notes (Signed)
Date and time results received: 03/11/20  0436  Test: covid Critical Value: +  Name of Provider Notified: Dr. Preston Fleeting  Orders Received? Or Actions Taken?: n/a

## 2020-04-16 DIAGNOSIS — Z0001 Encounter for general adult medical examination with abnormal findings: Secondary | ICD-10-CM | POA: Diagnosis not present

## 2020-04-16 DIAGNOSIS — N342 Other urethritis: Secondary | ICD-10-CM | POA: Diagnosis not present

## 2020-04-16 DIAGNOSIS — U099 Post covid-19 condition, unspecified: Secondary | ICD-10-CM | POA: Diagnosis not present

## 2020-04-16 DIAGNOSIS — Z1331 Encounter for screening for depression: Secondary | ICD-10-CM | POA: Diagnosis not present

## 2020-04-16 DIAGNOSIS — Z6841 Body Mass Index (BMI) 40.0 and over, adult: Secondary | ICD-10-CM | POA: Diagnosis not present

## 2020-04-28 ENCOUNTER — Encounter: Payer: Self-pay | Admitting: Emergency Medicine

## 2020-04-28 ENCOUNTER — Other Ambulatory Visit: Payer: Self-pay

## 2020-04-28 ENCOUNTER — Ambulatory Visit
Admission: EM | Admit: 2020-04-28 | Discharge: 2020-04-28 | Disposition: A | Discharge status: Home / Self Care | Payer: Federal, State, Local not specified - PPO | Attending: Emergency Medicine | Admitting: Emergency Medicine

## 2020-04-28 DIAGNOSIS — M79601 Pain in right arm: Secondary | ICD-10-CM | POA: Diagnosis not present

## 2020-04-28 DIAGNOSIS — M79602 Pain in left arm: Secondary | ICD-10-CM | POA: Diagnosis not present

## 2020-04-28 DIAGNOSIS — M79641 Pain in right hand: Secondary | ICD-10-CM

## 2020-04-28 DIAGNOSIS — M79642 Pain in left hand: Secondary | ICD-10-CM | POA: Diagnosis not present

## 2020-04-28 MED ORDER — CYCLOBENZAPRINE HCL 10 MG PO TABS: 10.0000 mg | ORAL_TABLET | Freq: Every day | ORAL | 0 refills | Status: DC

## 2020-04-28 MED ORDER — PREDNISONE 10 MG PO TABS: 20.0000 mg | ORAL_TABLET | Freq: Every day | ORAL | 0 refills | Status: DC

## 2020-04-28 NOTE — ED Triage Notes (Signed)
Patient states that she works at a [employer] in [employer]- has pulled muscle and strain in hands and arms x 1 week . no injury

## 2020-04-28 NOTE — Discharge Instructions (Addendum)
Prednisone was prescribed take as directed Flexeril was prescribed at bedtime/take as directed.  This medication can make you sleepy therefore do not drive or food machinery while taking this medication Continue to take OTC Tylenol as needed for pain Follow-up with PCP Return or go to ED if you develop any new or worsening of symptoms

## 2020-04-28 NOTE — ED Provider Notes (Signed)
[facility]ARE [facility]ENTER   [id number] 04/28/20 Arrival Time: 1823   Chief Complaint  Patient presents with  . Arm Pain     SUBJECTIVE: History from: patient.  [place] L Aho is a 39 y.o. female who presented to the urgent care with a complaint of bilateral arm and hand pain for the past 1 week. Patient reports she works at a [employer] in [place]. She localizes the pain to the bilateral arm and hand. She describes the pain as constant and achy. She has tried OTC medications without relief.  His symptoms are made worse with ROM. Her denies similar symptoms in the past. Denies chills, fever, trauma, injury, nausea, vomiting, diarrhea   ROS: As per HPI.  All other pertinent ROS negative.      Past Medical History:  Diagnosis Date  . Chronic pain   . Menorrhagia   . Migraine    Past Surgical History:  Procedure Laterality Date  . CERVICAL SPINE SURGERY    . CESAREAN SECTION    . HEAD & NECK WOUND REPAIR / CLOSURE    . TUBAL LIGATION     Allergies  Allergen Reactions  . Tylenol [Acetaminophen] Other (See Comments)    States, "will make my head hurt worse".   No current facility-administered medications on file prior to encounter.   Current Outpatient Medications on File Prior to Encounter  Medication Sig Dispense Refill  . benzonatate (TESSALON) 100 MG capsule Take 2 capsules (200 mg total) by mouth 3 (three) times daily as needed for cough. 20 capsule 0  . chlorhexidine (PERIDEX) 0.12 % solution Use as directed 15 mLs in the mouth or throat 2 (two) times daily. 120 mL 0   Social History   Socioeconomic History  . Marital status: Married    Spouse name: Not on file  . Number of children: Not on file  . Years of education: Not on file  . Highest education level: Not on file  Occupational History  . Not on file  Tobacco Use  . Smoking status: Current Every Day Smoker    Packs/day: 1.00    Years: 19.00    Pack years: 19.00    Types: Cigarettes  .  Smokeless tobacco: Never Used  Vaping Use  . Vaping Use: Never used  Substance and Sexual Activity  . Alcohol use: Yes    Comment: occas  . Drug use: No  . Sexual activity: Yes    Birth control/protection: Surgical  Other Topics Concern  . Not on file  Social History Narrative  . Not on file   Social Determinants of Health   Financial Resource Strain:   . Difficulty of Paying Living Expenses: Not on file  Food Insecurity:   . Worried About Programme researcher, broadcasting/film/video in the Last Year: Not on file  . Ran Out of Food in the Last Year: Not on file  Transportation Needs:   . Lack of Transportation (Medical): Not on file  . Lack of Transportation (Non-Medical): Not on file  Physical Activity:   . Days of Exercise per Week: Not on file  . Minutes of Exercise per Session: Not on file  Stress:   . Feeling of Stress : Not on file  Social Connections:   . Frequency of Communication with Friends and Family: Not on file  . Frequency of Social Gatherings with Friends and Family: Not on file  . Attends Religious Services: Not on file  . Active Member of Clubs or Organizations: Not on  file  . Attends Banker Meetings: Not on file  . Marital Status: Not on file  Intimate Partner Violence:   . Fear of Current or Ex-Partner: Not on file  . Emotionally Abused: Not on file  . Physically Abused: Not on file  . Sexually Abused: Not on file   Family History  Problem Relation Age of Onset  . Cancer Mother 76       Breast  . Cancer Paternal Grandmother   . Cancer Paternal Grandfather   . Cancer Maternal Aunt        breast    OBJECTIVE:  Vitals:   04/28/20 1833  BP: 136/84  Pulse: 95  Resp: 16  Temp: 98.4 F (36.9 C)  SpO2: 99%     Physical Exam Vitals and nursing note reviewed.  Constitutional:      General: She is not in acute distress.    Appearance: Normal appearance. She is normal weight. She is not ill-appearing, toxic-appearing or diaphoretic.  HENT:     Head:  Normocephalic.  Cardiovascular:     Rate and Rhythm: Normal rate and regular rhythm.     Pulses: Normal pulses.     Heart sounds: Normal heart sounds. No murmur heard.  No friction rub. No gallop.   Pulmonary:     Effort: Pulmonary effort is normal. No respiratory distress.     Breath sounds: Normal breath sounds. No stridor. No wheezing, rhonchi or rales.  Chest:     Chest wall: No tenderness.  Musculoskeletal:        General: Tenderness present.     Right upper arm: Tenderness present.     Left upper arm: Tenderness present.     Right hand: Tenderness present.     Left hand: Tenderness present.     Comments: There is no swelling, open wound, lesion, surface trauma present. Muscular tenderness present on palpation of bilateral upper arm and hand.  Neurological:     Mental Status: She is alert and oriented to person, place, and time.      LABS:  No results found for this or any previous visit (from the past 24 hour(s)).   ASSESSMENT & PLAN:  1. Bilateral arm pain   2. Bilateral hand pain     Meds ordered this encounter  Medications  . predniSONE (DELTASONE) 10 MG tablet    Sig: Take 2 tablets (20 mg total) by mouth daily.    Dispense:  15 tablet    Refill:  0  . cyclobenzaprine (FLEXERIL) 10 MG tablet    Sig: Take 1 tablet (10 mg total) by mouth at bedtime.    Dispense:  30 tablet    Refill:  0    Discharge instructions  Prednisone was prescribed take as directed Flexeril was prescribed at bedtime/take as directed.  This medication can make you sleepy therefore do not drive or food machinery while taking this medication Continue to take OTC Tylenol as needed for pain Follow-up with PCP Return or go to ED if you develop any new or worsening of symptoms  Reviewed expectations re: course of current medical issues. Questions answered. Outlined signs and symptoms indicating need for more acute intervention. Patient verbalized understanding. After Visit Summary  given.         Durward Parcel, FNP 04/28/20 [phone]

## 2020-05-16 ENCOUNTER — Encounter: Payer: Self-pay | Admitting: Emergency Medicine

## 2020-05-16 ENCOUNTER — Ambulatory Visit
Admission: EM | Admit: 2020-05-16 | Discharge: 2020-05-16 | Disposition: A | Discharge status: Home / Self Care | Payer: Federal, State, Local not specified - PPO

## 2020-05-16 ENCOUNTER — Other Ambulatory Visit: Payer: Self-pay

## 2020-05-16 ENCOUNTER — Encounter (HOSPITAL_COMMUNITY): Payer: Self-pay | Admitting: Emergency Medicine

## 2020-05-16 ENCOUNTER — Emergency Department (HOSPITAL_COMMUNITY)
Admission: EM | Admit: 2020-05-16 | Discharge: 2020-05-16 | Disposition: A | Discharge status: Home / Self Care | Payer: Federal, State, Local not specified - PPO | Attending: Emergency Medicine | Admitting: Emergency Medicine

## 2020-05-16 DIAGNOSIS — N939 Abnormal uterine and vaginal bleeding, unspecified: Secondary | ICD-10-CM | POA: Insufficient documentation

## 2020-05-16 DIAGNOSIS — R531 Weakness: Secondary | ICD-10-CM | POA: Diagnosis not present

## 2020-05-16 DIAGNOSIS — F17210 Nicotine dependence, cigarettes, uncomplicated: Secondary | ICD-10-CM | POA: Insufficient documentation

## 2020-05-16 DIAGNOSIS — D509 Iron deficiency anemia, unspecified: Secondary | ICD-10-CM | POA: Diagnosis not present

## 2020-05-16 LAB — CBC WITH DIFFERENTIAL/PLATELET
Abs Immature Granulocytes: 0.01 10*3/uL (ref 0.00–0.07)
Basophils Absolute: 0 10*3/uL (ref 0.0–0.1)
Basophils Relative: 1 %
Eosinophils Absolute: 0.2 10*3/uL (ref 0.0–0.5)
Eosinophils Relative: 3 %
HCT: 31.2 % — ABNORMAL LOW (ref 36.0–46.0)
Hemoglobin: 8.4 g/dL — ABNORMAL LOW (ref 12.0–15.0)
Immature Granulocytes: 0 %
Lymphocytes Relative: 23 %
Lymphs Abs: 1.3 10*3/uL (ref 0.7–4.0)
MCH: 18 pg — ABNORMAL LOW (ref 26.0–34.0)
MCHC: 26.9 g/dL — ABNORMAL LOW (ref 30.0–36.0)
MCV: 66.8 fL — ABNORMAL LOW (ref 80.0–100.0)
Monocytes Absolute: 0.5 10*3/uL (ref 0.1–1.0)
Monocytes Relative: 8 %
Neutro Abs: 3.8 10*3/uL (ref 1.7–7.7)
Neutrophils Relative %: 65 %
Platelets: 274 10*3/uL (ref 150–400)
RBC: 4.67 MIL/uL (ref 3.87–5.11)
RDW: 20.3 % — ABNORMAL HIGH (ref 11.5–15.5)
WBC: 5.8 10*3/uL (ref 4.0–10.5)
nRBC: 0 % (ref 0.0–0.2)

## 2020-05-16 LAB — BASIC METABOLIC PANEL
Anion gap: 8 (ref 5–15)
BUN: 15 mg/dL (ref 6–20)
CO2: 22 mmol/L (ref 22–32)
Calcium: 8.9 mg/dL (ref 8.9–10.3)
Chloride: 105 mmol/L (ref 98–111)
Creatinine, Ser: 0.54 mg/dL (ref 0.44–1.00)
GFR, Estimated: >60 mL/min (ref >60)
Glucose, Bld: 100 mg/dL — ABNORMAL HIGH (ref 70–99)
Potassium: 4.3 mmol/L (ref 3.5–5.1)
Sodium: 135 mmol/L (ref 135–145)

## 2020-05-16 MED ORDER — FERROUS SULFATE 325 (65 FE) MG PO TABS: 325.0000 mg | ORAL_TABLET | Freq: Every day | ORAL | 0 refills | Status: DC

## 2020-05-16 NOTE — ED Notes (Signed)
Pauline Aus, PA informed of pt taking goody powders daily

## 2020-05-16 NOTE — ED Triage Notes (Signed)
Pt c/o fatigue and paleness with a hx of anemia. Pt states she has had a heavy menstrual cycle for the past three days.

## 2020-05-16 NOTE — ED Triage Notes (Signed)
Heavy vaginal bleeding that started on Thursday.  Pt reports she has had a lot of fatigue since then.  Reports bleeding through a tampon and 1 pad per hour.

## 2020-05-16 NOTE — ED Notes (Signed)
Patient is being discharged from the Urgent Care and sent to the Emergency Department via pov . Per K.Avegno, patient is in need of higher level of care due to heavy vaginal bleeding. Patient is aware and verbalizes understanding of plan of care.  Vitals:   05/16/20 1551  BP: 120/79  Pulse: (!) 108  Resp: 19  Temp: 98.2 F (36.8 C)  SpO2: 96%

## 2020-05-16 NOTE — Discharge Instructions (Signed)
Your hemoglobin level today was 8.4.  This is the likely cause of your fatigue.  It is important that you take your iron supplement daily.  Take the medication along with an over-the-counter stool softener such as Colace to help prevent constipation.  Call your primary care provider on Monday to arrange a follow-up appointment.  Stop taking BC or Goody powders.

## 2020-05-16 NOTE — ED Notes (Signed)
Pt reports taking goody powders twice daily for the last 4  Years.

## 2020-05-16 NOTE — ED Provider Notes (Signed)
[facility] EMERGENCY DEPARTMENT Provider Note   CSN: [id number] Arrival date & time: 05/16/20  1627     History Chief Complaint  Patient presents with  . Fatigue    Katelyn Hill is a 39 y.o. female.  HPI      Katelyn Hill is a 39 y.o. female who presents to the Emergency Department complaining of generalized weakness and fatigue.  Requesting evaluation of possible anemia.  Symptoms have been present for several days.  She reports heavy vaginal bleeding for 3 days.  Has history of menorrhagia and she admits to taking BC powders 2-3 times daily for years due to chronic pain of her knees, hands and shoulders.  No tarry or bloody stools.  No abdominal pain, dizziness, syncope, vomiting or headache.  She does endorse a history of anemia and supposed to take iron supplements, but does not take them due to constipation.  No fever or chills, no recent illness.  She was diagnosed with COVID back in October.    Past Medical History:  Diagnosis Date  . Chronic pain   . Menorrhagia   . Migraine     Patient Active Problem List   Diagnosis Date Noted  . Smoker 08/19/2014  . Female pelvic pain 08/19/2014  . Family history of breast cancer in mother 08/19/2014    Past Surgical History:  Procedure Laterality Date  . CERVICAL SPINE SURGERY    . CESAREAN SECTION    . HEAD & NECK WOUND REPAIR / CLOSURE    . TUBAL LIGATION       OB History    Gravida  4   Para  4   Term  4   Preterm      AB      Living  4     SAB      IAB      Ectopic      Multiple      Live Births              Family History  Problem Relation Age of Onset  . Cancer Mother 58       Breast  . Cancer Paternal Grandmother   . Cancer Paternal Grandfather   . Cancer Maternal Aunt        breast    Social History   Tobacco Use  . Smoking status: Current Every Day Smoker    Packs/day: 1.00    Years: 19.00    Pack years: 19.00    Types: Cigarettes  . Smokeless tobacco: Never  Used  Vaping Use  . Vaping Use: Never used  Substance Use Topics  . Alcohol use: Yes    Comment: occas  . Drug use: No    Home Medications Prior to Admission medications   Medication Sig Start Date End Date Taking? Authorizing Provider  benzonatate (TESSALON) 100 MG capsule Take 2 capsules (200 mg total) by mouth 3 (three) times daily as needed for cough. 03/11/20   Dione Booze, MD  chlorhexidine (PERIDEX) 0.12 % solution Use as directed 15 mLs in the mouth or throat 2 (two) times daily. 07/26/19   Harlene Salts A, PA-C  cyclobenzaprine (FLEXERIL) 10 MG tablet Take 1 tablet (10 mg total) by mouth at bedtime. 04/28/20   Avegno, Zachery Dakins, FNP  predniSONE (DELTASONE) 10 MG tablet Take 2 tablets (20 mg total) by mouth daily. 04/28/20   Durward Parcel, FNP    Allergies    Tylenol [acetaminophen]  Review of Systems  Review of Systems  Constitutional: Positive for fatigue. Negative for activity change, appetite change, chills and fever.  HENT: Negative for sore throat and trouble swallowing.   Respiratory: Negative for cough, shortness of breath and wheezing.   Cardiovascular: Negative for chest pain and palpitations.  Gastrointestinal: Negative for abdominal pain, blood in stool, diarrhea, nausea and vomiting.  Genitourinary: Positive for vaginal bleeding. Negative for dysuria, flank pain and hematuria.  Musculoskeletal: Positive for arthralgias. Negative for back pain, myalgias, neck pain and neck stiffness.  Skin: Negative for rash.  Neurological: Positive for weakness (generalized weakness). Negative for dizziness, syncope, numbness and headaches.  Hematological: Does not bruise/bleed easily.  Psychiatric/Behavioral: Negative for confusion.    Physical Exam Updated Vital Signs BP (!) 122/51 (BP Location: Right Arm)   Pulse 100   Temp 98.3 F (36.8 C) (Oral)   Resp 18   Ht 5\' 1"  (1.549 m)   Wt 99.8 kg   LMP 05/15/2020   SpO2 100%   BMI 41.57 kg/m   Physical  Exam Vitals and nursing note reviewed.  Constitutional:      Appearance: Normal appearance. She is not ill-appearing or toxic-appearing.  HENT:     Mouth/Throat:     Mouth: Mucous membranes are moist.  Eyes:     Conjunctiva/sclera: Conjunctivae normal.     Pupils: Pupils are equal, round, and reactive to light.  Cardiovascular:     Rate and Rhythm: Normal rate and regular rhythm.     Pulses: Normal pulses.  Pulmonary:     Effort: Pulmonary effort is normal.     Breath sounds: Normal breath sounds.  Chest:     Chest wall: No tenderness.  Abdominal:     General: There is no distension.     Palpations: Abdomen is soft.     Tenderness: There is no abdominal tenderness. There is no right CVA tenderness or left CVA tenderness.  Musculoskeletal:        General: Normal range of motion.     Right lower leg: No edema.     Left lower leg: No edema.  Skin:    General: Skin is warm.     Capillary Refill: Capillary refill takes less than 2 seconds.     Findings: No rash.  Neurological:     General: No focal deficit present.     Mental Status: She is alert.     Sensory: Sensation is intact. No sensory deficit.     Motor: Motor function is intact. No weakness.     Comments: CN III-XII grossly intact.  Speech clear. Grip strength strong and equal.    Psychiatric:        Mood and Affect: Mood normal.     ED Results / Procedures / Treatments   Labs (all labs ordered are listed, but only abnormal results are displayed) Labs Reviewed  CBC WITH DIFFERENTIAL/PLATELET - Abnormal; Notable for the following components:      Result Value   Hemoglobin 8.4 (*)    HCT 31.2 (*)    MCV 66.8 (*)    MCH 18.0 (*)    MCHC 26.9 (*)    RDW 20.3 (*)    All other components within normal limits  BASIC METABOLIC PANEL - Abnormal; Notable for the following components:   Glucose, Bld 100 (*)    All other components within normal limits  I-STAT BETA HCG BLOOD, ED ([facility], [facility], [facility] ONLY)     EKG None  Radiology No results found.  Procedures  Procedures (including critical care time)  Medications Ordered in ED Medications - No data to display  ED Course  I have reviewed the triage vital signs and the nursing notes.  Pertinent labs & imaging results that were available during my care of the patient were reviewed by me and considered in my medical decision making (see chart for details).    MDM Rules/Calculators/A&P                          POC HCG result reported to me by nursing as <5  Pt has hx of anemia.  Admits to not taking her iron.  Hgb of 8.4 was 9.2 two years ago.    She is overall well-appearing, vital signs reviewed.  No concerning symptoms for emergent process or active GI bleed.  Abdomen is soft and nontender.  Patient strongly advised to discontinue NSAIDs.  Kidney functions reassuring.  Patient agrees to restart her iron supplement, prescription provided.  Have also advised to take over-the-counter stool softener to help with her constipation.  She appears appropriate for discharge home, return precautions discussed.   Final Clinical Impression(s) / ED Diagnoses Final diagnoses:  Iron deficiency anemia, unspecified iron deficiency anemia type    Rx / DC Orders ED Discharge Orders    None       Pauline Aus, PA-C 05/18/20 1248    Bethann Berkshire, MD 05/18/20 1525

## 2020-05-17 LAB — I-STAT BETA HCG BLOOD, ED (MC, WL, AP ONLY): I-stat hCG, quantitative: <5 m[IU]/mL (ref <5)

## 2020-06-04 DIAGNOSIS — Z681 Body mass index (BMI) 19 or less, adult: Secondary | ICD-10-CM | POA: Diagnosis not present

## 2020-06-04 DIAGNOSIS — A6000 Herpesviral infection of urogenital system, unspecified: Secondary | ICD-10-CM | POA: Diagnosis not present

## 2020-06-04 DIAGNOSIS — J019 Acute sinusitis, unspecified: Secondary | ICD-10-CM | POA: Diagnosis not present

## 2020-08-10 DIAGNOSIS — F419 Anxiety disorder, unspecified: Secondary | ICD-10-CM | POA: Diagnosis not present

## 2020-08-10 DIAGNOSIS — Z6841 Body Mass Index (BMI) 40.0 and over, adult: Secondary | ICD-10-CM | POA: Diagnosis not present

## 2020-08-10 DIAGNOSIS — M545 Low back pain, unspecified: Secondary | ICD-10-CM | POA: Diagnosis not present

## 2020-08-10 DIAGNOSIS — R39198 Other difficulties with micturition: Secondary | ICD-10-CM | POA: Diagnosis not present

## 2020-10-21 DIAGNOSIS — Z1389 Encounter for screening for other disorder: Secondary | ICD-10-CM | POA: Diagnosis not present

## 2020-10-21 DIAGNOSIS — D649 Anemia, unspecified: Secondary | ICD-10-CM | POA: Diagnosis not present

## 2020-10-21 DIAGNOSIS — Z1331 Encounter for screening for depression: Secondary | ICD-10-CM | POA: Diagnosis not present

## 2020-10-21 DIAGNOSIS — F419 Anxiety disorder, unspecified: Secondary | ICD-10-CM | POA: Diagnosis not present

## 2020-10-21 DIAGNOSIS — Z6841 Body Mass Index (BMI) 40.0 and over, adult: Secondary | ICD-10-CM | POA: Diagnosis not present

## 2020-10-21 DIAGNOSIS — R5383 Other fatigue: Secondary | ICD-10-CM | POA: Diagnosis not present

## 2020-11-05 ENCOUNTER — Other Ambulatory Visit: Payer: Self-pay

## 2020-11-05 ENCOUNTER — Encounter: Payer: Self-pay | Admitting: Emergency Medicine

## 2020-11-05 ENCOUNTER — Ambulatory Visit
Admission: EM | Admit: 2020-11-05 | Discharge: 2020-11-05 | Disposition: A | Discharge status: Home / Self Care | Payer: Federal, State, Local not specified - PPO | Attending: Internal Medicine | Admitting: Internal Medicine

## 2020-11-05 DIAGNOSIS — Z0279 Encounter for issue of other medical certificate: Secondary | ICD-10-CM

## 2020-11-05 DIAGNOSIS — Z5689 Other problems related to employment: Secondary | ICD-10-CM | POA: Diagnosis not present

## 2020-11-05 NOTE — Discharge Instructions (Addendum)
Please call the [place] employee wellness clinic to make an appointment for further assessment. Patient came to the [facility] health urgent care at [facility] today.  She was redirected to go to [facility] health employee wellness clinic to have her occupational health issues resolved.

## 2020-11-05 NOTE — ED Triage Notes (Signed)
Pt is here to get medical clearance to return to work .... reports she was Rx Buspar and has been out of work 6 months   A&O x4... NAD.Marland Kitchen. ambulatory

## 2020-11-08 NOTE — ED Provider Notes (Signed)
[facility]    CSN: [id number] Arrival date & time: 11/05/20  1858      History   Chief Complaint Chief Complaint  Patient presents with   Medical Clearance    HPI Katelyn Hill is a 41 y.o. female comes to urgent care requesting medical clearance.  Patient has not been to work for the past 6 months.  She complains of worsening anxiety and at that time quit taking the BuSpar.  She has no acute needs at this time and is requesting medical  clearance to work.Marland Kitchen   HPI  Past Medical History:  Diagnosis Date   Chronic pain    Menorrhagia    Migraine     Patient Active Problem List   Diagnosis Date Noted   Smoker 08/19/2014   Female pelvic pain 08/19/2014   Family history of breast cancer in mother 08/19/2014    Past Surgical History:  Procedure Laterality Date   CERVICAL SPINE SURGERY     CESAREAN SECTION     HEAD & NECK WOUND REPAIR / CLOSURE     TUBAL LIGATION      OB History     Gravida  4   Para  4   Term  4   Preterm      AB      Living  4      SAB      IAB      Ectopic      Multiple      Live Births               Home Medications    Prior to Admission medications   Medication Sig Start Date End Date Taking? Authorizing Provider  clobetasol ointment (TEMOVATE) 0.05 % Apply topically 2 (two) times daily. 01/10/20   [provider]  ferrous sulfate 325 (65 FE) MG tablet Take 1 tablet (325 mg total) by mouth daily. 05/16/20   Pauline Aus, PA-C    Family History Family History  Problem Relation Age of Onset   Cancer Mother 13       Breast   Cancer Paternal Grandmother    Cancer Paternal Grandfather    Cancer Maternal Aunt        breast    Social History Social History   Tobacco Use   Smoking status: Every Day    Packs/day: 1.00    Years: 19.00    Pack years: 19.00    Types: Cigarettes   Smokeless tobacco: Never  Vaping Use   Vaping Use: Never used  Substance Use Topics   Alcohol use: Yes     Comment: occas   Drug use: No     Allergies   Tylenol [acetaminophen]   Review of Systems Review of Systems  All other systems reviewed and are negative.   Physical Exam Triage Vital Signs ED Triage Vitals  Enc Vitals Group     BP 11/05/20 1919 132/71     Pulse Rate 11/05/20 1919 (!) 103     Resp 11/05/20 1919 17     Temp 11/05/20 1919 98.9 F (37.2 C)     Temp Source 11/05/20 1919 Oral     SpO2 11/05/20 1919 98 %     Weight --      Height --      Head Circumference --      Peak Flow --      Pain Score 11/05/20 1926 0     Pain Loc --  Pain Edu? --      Excl. in GC? --    No data found.  Updated Vital Signs BP 132/71 (BP Location: Right Arm)   Pulse (!) 103   Temp 98.9 F (37.2 C) (Oral)   Resp 17   SpO2 98%   Visual Acuity Right Eye Distance:   Left Eye Distance:   Bilateral Distance:    Right Eye Near:   Left Eye Near:    Bilateral Near:     Physical Exam Vitals and nursing note reviewed.  Constitutional:      General: She is not in acute distress.    Appearance: She is not ill-appearing.  Cardiovascular:     Rate and Rhythm: Normal rate and regular rhythm.  Pulmonary:     Effort: Pulmonary effort is normal.     Breath sounds: Normal breath sounds.  Neurological:     Mental Status: She is alert.     UC Treatments / Results  Labs (all labs ordered are listed, but only abnormal results are displayed) Labs Reviewed - No data to display  EKG   Radiology No results found.  Procedures Procedures (including critical care time)  Medications Ordered in UC Medications - No data to display  Initial Impression / Assessment and Plan / UC Course  I have reviewed the triage vital signs and the nursing notes.  Pertinent labs & imaging results that were available during my care of the patient were reviewed by me and considered in my medical decision making (see chart for details).     1.  Work-related condition: Patient is not suicidal  or homicidal at this time. Patient has been off her anxiety medications for 6 months was dealing with some social stressors Occupational health will be a better avenue to evaluate her readiness for work and given medical clearance Patient was given occupational health resources to follow-up with. Final Clinical Impressions(s) / UC Diagnoses   Final diagnoses:  Work-related condition     Discharge Instructions      Please call the [place] employee wellness clinic to make an appointment for further assessment. Patient came to the [facility] health urgent care at [facility] today.  She was redirected to go to [facility] health employee wellness clinic to have her occupational health issues resolved.   ED Prescriptions   None    PDMP not reviewed this encounter.   Merrilee Jansky, MD 11/08/20 1131

## 2021-01-22 ENCOUNTER — Encounter: Payer: Self-pay | Admitting: Emergency Medicine

## 2021-01-22 ENCOUNTER — Other Ambulatory Visit: Payer: Self-pay

## 2021-01-22 ENCOUNTER — Ambulatory Visit
Admission: EM | Admit: 2021-01-22 | Discharge: 2021-01-22 | Disposition: A | Discharge status: Home / Self Care | Payer: Federal, State, Local not specified - PPO | Attending: Emergency Medicine | Admitting: Emergency Medicine

## 2021-01-22 DIAGNOSIS — G5603 Carpal tunnel syndrome, bilateral upper limbs: Secondary | ICD-10-CM | POA: Diagnosis not present

## 2021-01-22 DIAGNOSIS — L409 Psoriasis, unspecified: Secondary | ICD-10-CM | POA: Insufficient documentation

## 2021-01-22 HISTORY — DX: Psoriasis, unspecified: L40.9

## 2021-01-22 MED ORDER — PREDNISONE 10 MG (21) PO TBPK: ORAL_TABLET | Freq: Every day | ORAL | 0 refills | Status: DC

## 2021-01-22 NOTE — Discharge Instructions (Addendum)
Continue conservative management of rest, ice, and gentle stretches Wrist brace placed to left wrist; wear 24/7 Prescribed prednisone Follow up with hand specialist Return or go to the ER if you have any new or worsening symptoms (fever, chills, chest pain, redness, bruising, swelling, deformity, etc...)

## 2021-01-22 NOTE — ED Triage Notes (Signed)
Pt presents today with c/o of bilateral hand pain that began 3 days ago. She does report hx of carpal tunnel bilaterally and is requesting Prednisone to help with pain. Denies injury.

## 2021-01-22 NOTE — ED Provider Notes (Signed)
[facility]   [id number] 01/22/21 Arrival Time: 1144  CC: bilateral wrist PAIN  SUBJECTIVE: History from: patient. [place] L Droessler is a 40 y.o. female complains of acute on chronic CTS flare that started a few days ago.  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  Describes the pain as intermittent and sore in character.  Has tried OTC medications without relief.  Symptoms are made worse with work.  Reports similar symptoms in the past.  Denies fever, chills, erythema, ecchymosis, effusion, weakness.  ROS: As per HPI.  All other pertinent ROS negative.     Past Medical History:  Diagnosis Date   Chronic pain    Menorrhagia    Migraine    Psoriasis    Past Surgical History:  Procedure Laterality Date   CERVICAL SPINE SURGERY     CESAREAN SECTION     HEAD & NECK WOUND REPAIR / CLOSURE     TUBAL LIGATION     Allergies  Allergen Reactions   Tylenol [Acetaminophen] Other (See Comments)    States, "will make my head hurt worse".   No current facility-administered medications on file prior to encounter.   Current Outpatient Medications on File Prior to Encounter  Medication Sig Dispense Refill   clobetasol ointment (TEMOVATE) 0.05 % Apply topically 2 (two) times daily.     ferrous sulfate 325 (65 FE) MG tablet Take 1 tablet (325 mg total) by mouth daily. 30 tablet 0   Social History   Socioeconomic History   Marital status: Married    Spouse name: Not on file   Number of children: Not on file   Years of education: Not on file   Highest education level: Not on file  Occupational History   Not on file  Tobacco Use   Smoking status: Every Day    Packs/day: 1.00    Years: 19.00    Pack years: 19.00    Types: Cigarettes   Smokeless tobacco: Never  Vaping Use   Vaping Use: Never used  Substance and Sexual Activity   Alcohol use: Yes    Comment: occas   Drug use: No   Sexual activity: Yes    Birth control/protection: Surgical   Other Topics Concern   Not on file  Social History Narrative   Not on file   Social Determinants of Health   Financial Resource Strain: Not on file  Food Insecurity: Not on file  Transportation Needs: Not on file  Physical Activity: Not on file  Stress: Not on file  Social Connections: Not on file  Intimate Partner Violence: Not on file   Family History  Problem Relation Age of Onset   Cancer Mother 68       Breast   Cancer Paternal Grandmother    Cancer Paternal Grandfather    Cancer Maternal Aunt        breast    OBJECTIVE:  Vitals:   01/22/21 1157  BP: 115/77  Pulse: 81  Resp: 18  Temp: 98.5 F (36.9 C)  TempSrc: Oral  SpO2: 96%    General appearance: ALERT; in no acute distress.  Head: NCAT Lungs: Normal respiratory effort CV: radial pulse 2+ Musculoskeletal: Bilateral wrist Inspection: Skin warm, dry, clear and intact without obvious erythema, effusion, or ecchymosis.  Palpation: Nontender to palpation ROM: FROM active and passive Strength: 5/5 shld abduction, 5/5 shld adduction, 5/5 elbow flexion, 5/5 elbow extension, 5/5 grip strength, 5/5 hip flexion, 5/5 knee abduction, 5/5  knee adduction, 5/5 knee flexion, 5/5 knee extension, 5/5 dorsiflexion, 5/5 plantar flexion Stability: Anterior/ posterior drawer intact Skin: warm and dry Neurologic: Ambulates without difficulty; Sensation intact about the upper/ lower extremities Psychological: alert and cooperative; normal mood and affect   ASSESSMENT & PLAN:  1. Bilateral carpal tunnel syndrome      Meds ordered this encounter  Medications   predniSONE (STERAPRED UNI-PAK 21 TAB) 10 MG (21) TBPK tablet    Sig: Take by mouth daily. Take 6 tabs by mouth daily  for 2 days, then 5 tabs for 2 days, then 4 tabs for 2 days, then 3 tabs for 2 days, 2 tabs for 2 days, then 1 tab by mouth daily for 2 days    Dispense:  42 tablet    Refill:  0    Order Specific Question:   Supervising Provider    Answer:    Eustace Moore [[id number]]     Continue conservative management of rest, ice, and gentle stretches Wrist brace placed to left wrist; wear 24/7 Prescribed prednisone Follow up with hand specialist Return or go to the ER if you have any new or worsening symptoms (fever, chills, chest pain, redness, bruising, swelling, deformity, etc...)   Reviewed expectations re: course of current medical issues. Questions answered. Outlined signs and symptoms indicating need for more acute intervention. Patient verbalized understanding. After Visit Summary given.     Rennis Harding, PA-C 01/22/21 1224

## 2021-02-13 ENCOUNTER — Other Ambulatory Visit: Payer: Self-pay

## 2021-02-13 ENCOUNTER — Ambulatory Visit
Admission: EM | Admit: 2021-02-13 | Discharge: 2021-02-13 | Disposition: A | Discharge status: Home / Self Care | Payer: Federal, State, Local not specified - PPO | Attending: Family Medicine | Admitting: Family Medicine

## 2021-02-13 DIAGNOSIS — M5441 Lumbago with sciatica, right side: Secondary | ICD-10-CM

## 2021-02-13 MED ORDER — PREDNISONE 20 MG PO TABS: 20.0000 mg | ORAL_TABLET | Freq: Every day | ORAL | 0 refills | Status: AC

## 2021-02-13 MED ORDER — TIZANIDINE HCL 4 MG PO TABS: 4.0000 mg | ORAL_TABLET | Freq: Every day | ORAL | 0 refills | Status: DC

## 2021-02-13 MED ORDER — KETOROLAC TROMETHAMINE 60 MG/2ML IM SOLN
60.0000 mg | Freq: Once | INTRAMUSCULAR | Status: AC
  Administered 2021-02-13: 60 mg via INTRAMUSCULAR

## 2021-02-13 NOTE — ED Triage Notes (Signed)
Pt presents with low back pain that radiates down right leg for past couple of days

## 2021-02-13 NOTE — ED Provider Notes (Signed)
[facility]    CSN: [id number] Arrival date & time: 02/13/21  0813      History   Chief Complaint Chief Complaint  Patient presents with   Back Pain    HPI Katelyn Hill is a 40 y.o. female.   HPI Patient presents today with back pain radiating down the right leg.  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  She has taken ibuprofen and BC powders without relief of pain.  She is also taken a warm bath which seem to have helped some with the pain.  Pain begins at the low back and radiates down her right leg ceasing at her ankle.  She denies any numbness or tingling involving the right foot.  No history of recurrent back pain.  Past Medical History:  Diagnosis Date   Chronic pain    Menorrhagia    Migraine    Psoriasis     Patient Active Problem List   Diagnosis Date Noted   Psoriasis 01/22/2021   Smoker 08/19/2014   Female pelvic pain 08/19/2014   Family history of breast cancer in mother 08/19/2014    Past Surgical History:  Procedure Laterality Date   CERVICAL SPINE SURGERY     CESAREAN SECTION     HEAD & NECK WOUND REPAIR / CLOSURE     TUBAL LIGATION      OB History     Gravida  4   Para  4   Term  4   Preterm      AB      Living  4      SAB      IAB      Ectopic      Multiple      Live Births               Home Medications    Prior to Admission medications   Medication Sig Start Date End Date Taking? Authorizing Provider  predniSONE (DELTASONE) 20 MG tablet Take 1 tablet (20 mg total) by mouth daily with breakfast for 5 days. 02/13/21 02/18/21 Yes Bing Neighbors, FNP  tiZANidine (ZANAFLEX) 4 MG tablet Take 1 tablet (4 mg total) by mouth at bedtime. 02/13/21  Yes Bing Neighbors, FNP  clobetasol ointment (TEMOVATE) 0.05 % Apply topically 2 (two) times daily. 01/10/20   [provider]  ferrous sulfate 325 (65  FE) MG tablet Take 1 tablet (325 mg total) by mouth daily. 05/16/20   Pauline Aus, PA-C    Family History Family History  Problem Relation Age of Onset   Cancer Mother 15       Breast   Cancer Paternal Grandmother    Cancer Paternal Grandfather    Cancer Maternal Aunt        breast    Social History Social History   Tobacco Use   Smoking status: Every Day    Packs/day: 1.00    Years: 19.00    Pack years: 19.00    Types: Cigarettes   Smokeless tobacco: Never  Vaping Use   Vaping Use: Never used  Substance Use Topics   Alcohol use: Yes    Comment: occas   Drug use: No     Allergies   Tylenol [acetaminophen]   Review of Systems Review of Systems Pertinent negatives listed in HPI   Physical Exam Triage Vital Signs ED Triage Vitals [02/13/21 0849]  Enc Vitals Group     BP (!) 142/84     Pulse Rate 81     Resp 20     Temp 98.6 F (37 C)     Temp src      SpO2 97 %     Weight      Height      Head Circumference      Peak Flow      Pain Score      Pain Loc      Pain Edu?      Excl. in GC?    No data found.  Updated Vital Signs BP (!) 142/84   Pulse 81   Temp 98.6 F (37 C)   Resp 20   LMP 02/01/2021   SpO2 97%   Visual Acuity Right Eye Distance:   Left Eye Distance:   Bilateral Distance:    Right Eye Near:   Left Eye Near:    Bilateral Near:     Physical Exam Constitutional:      Appearance: Normal appearance.  Cardiovascular:     Rate and Rhythm: Normal rate and regular rhythm.  Pulmonary:     Effort: Pulmonary effort is normal.     Breath sounds: Normal breath sounds.  Musculoskeletal:     Lumbar back: No spasms. Positive right straight leg raise test. Negative left straight leg raise test.  Skin:    General: Skin is warm.     Capillary Refill: Capillary refill takes less than 2 seconds.  Neurological:     General: No focal deficit present.     Mental Status: She is alert.  Psychiatric:        Mood and Affect: Mood  normal.        Behavior: Behavior normal.        Thought Content: Thought content normal.        Judgment: Judgment normal.     UC Treatments / Results  Labs (all labs ordered are listed, but only abnormal results are displayed) Labs Reviewed - No data to display  EKG   Radiology No results found.  Procedures Procedures (including critical care time)  Medications Ordered in UC Medications  ketorolac (TORADOL) injection 60 mg (has no administration in time range)    Initial Impression / Assessment and Plan / UC Course  I have reviewed the triage vital signs and the nursing notes.  Pertinent labs & imaging results that were available during my care of the patient were reviewed by me and considered in my medical decision making (see chart for details).    Acute right sided low back pain with sciatica. Treatment today with per discharge medication orders. Recommended heat applications to reduce pain. Work note provided. RTC as needed. Final Clinical Impressions(s) / UC Diagnoses   Final diagnoses:  Acute right-sided low back pain with right-sided sciatica   Discharge Instructions   None    ED Prescriptions     Medication Sig Dispense Auth. Provider   predniSONE (DELTASONE) 20 MG tablet Take 1 tablet (20 mg total) by mouth daily with breakfast for 5 days. 5 tablet Bing Neighbors, FNP   tiZANidine (ZANAFLEX) 4 MG tablet Take 1 tablet (4 mg total) by mouth at bedtime. 12 tablet Bing Neighbors, FNP      PDMP not reviewed this encounter.   Bing Neighbors, [place] 02/13/21 [phone]

## 2021-04-25 ENCOUNTER — Ambulatory Visit
Admission: EM | Admit: 2021-04-25 | Discharge: 2021-04-25 | Disposition: A | Discharge status: Home / Self Care | Payer: Federal, State, Local not specified - PPO | Attending: Family Medicine | Admitting: Family Medicine

## 2021-04-25 ENCOUNTER — Other Ambulatory Visit: Payer: Self-pay

## 2021-04-25 DIAGNOSIS — R112 Nausea with vomiting, unspecified: Secondary | ICD-10-CM | POA: Insufficient documentation

## 2021-04-25 DIAGNOSIS — K2900 Acute gastritis without bleeding: Secondary | ICD-10-CM | POA: Insufficient documentation

## 2021-04-25 LAB — POCT URINALYSIS DIP (MANUAL ENTRY)
Bilirubin, UA: NEGATIVE
Glucose, UA: NEGATIVE mg/dL
Ketones, POC UA: NEGATIVE mg/dL
Nitrite, UA: POSITIVE — AB
Protein Ur, POC: NEGATIVE mg/dL
Spec Grav, UA: >=1.03 — AB (ref 1.010–1.025)
Urobilinogen, UA: 0.2 E.U./dL
pH, UA: 5.5 (ref 5.0–8.0)

## 2021-04-25 MED ORDER — ONDANSETRON HCL 4 MG PO TABS: 4.0000 mg | ORAL_TABLET | Freq: Four times a day (QID) | ORAL | 0 refills | Status: DC

## 2021-04-25 MED ORDER — FAMOTIDINE 20 MG PO TABS: 20.0000 mg | ORAL_TABLET | Freq: Two times a day (BID) | ORAL | 0 refills | Status: AC

## 2021-04-25 NOTE — ED Triage Notes (Signed)
Patient states her stomach is bothering her. She drank alcohol on Wednesday night and her stomach feels like it is on fire. Diarrhea for 2 days. She states vomiting x10 since Thursday. When she burps the room smells like old eggs.  She tried tums without any relief.

## 2021-04-25 NOTE — ED Provider Notes (Signed)
Katelyn Hill    CSN: [id number] Arrival date & time: 04/25/21  1152      History   Chief Complaint No chief complaint on file.   HPI [name] is a 40 y.o. female.   HPI Patient presents for days of GI symptoms including nausea with vomiting, watery loose stools, nausea with vomiting.  Patient reports drinking alcohol a few days ago and subsequently the symptoms developed the next day after Thanksgiving meal.  She has been able to eat however eating causes nausea and vomiting along with copious triggers loose stools.  She has been afebrile.  Endorses generalized abdominal achiness.  Reports that her belching stinks and she is frequently belching.  She has taken Tums without relief of symptoms.   Past Medical History:  Diagnosis Date   Chronic pain    Menorrhagia    Migraine    Psoriasis     Patient Active Problem List   Diagnosis Date Noted   Psoriasis 01/22/2021   Smoker 08/19/2014   Female pelvic pain 08/19/2014   Family history of breast cancer in mother 08/19/2014    Past Surgical History:  Procedure Laterality Date   CERVICAL SPINE SURGERY     CESAREAN SECTION     HEAD & NECK WOUND REPAIR / CLOSURE     TUBAL LIGATION      OB History     Gravida  4   Para  4   Term  4   Preterm      AB      Living  4      SAB      IAB      Ectopic      Multiple      Live Births               Home Medications    Prior to Admission medications   Medication Sig Start Date End Date Taking? Authorizing Provider  famotidine (PEPCID) 20 MG tablet Take 1 tablet (20 mg total) by mouth 2 (two) times daily for 7 days. 04/25/21 05/02/21 Yes Bing Neighbors, FNP  ibuprofen (ADVIL) 100 MG/5ML suspension Take 200 mg by mouth every 4 (four) hours as needed.   Yes [provider]  ondansetron (ZOFRAN) 4 MG tablet Take 1 tablet (4 mg total) by mouth every 6 (six) hours. 04/25/21  Yes Bing Neighbors, FNP  clobetasol ointment (TEMOVATE)  0.05 % Apply topically 2 (two) times daily. 01/10/20   [provider]  ferrous sulfate 325 (65 FE) MG tablet Take 1 tablet (325 mg total) by mouth daily. 05/16/20   Triplett, Tammy, PA-C  tiZANidine (ZANAFLEX) 4 MG tablet Take 1 tablet (4 mg total) by mouth at bedtime. 02/13/21   Bing Neighbors, FNP    Family History Family History  Problem Relation Age of Onset   Cancer Mother 61       Breast   Cancer Paternal Grandmother    Cancer Paternal Grandfather    Cancer Maternal Aunt        breast    Social History Social History   Tobacco Use   Smoking status: Every Day    Packs/day: 1.00    Years: 19.00    Pack years: 19.00    Types: Cigarettes   Smokeless tobacco: Never  Vaping Use   Vaping Use: Never used  Substance Use Topics   Alcohol use: Yes    Comment: occas   Drug use: No  Allergies   Tylenol [acetaminophen]   Review of Systems Review of Systems Pertinent negatives listed in HPI   Physical Exam Triage Vital Signs ED Triage Vitals  Enc Vitals Group     BP 04/25/21 1504 121/81     Pulse Rate 04/25/21 1504 93     Resp 04/25/21 1504 16     Temp 04/25/21 1504 98.3 F (36.8 C)     Temp Source 04/25/21 1504 Tympanic     SpO2 04/25/21 1504 97 %     Weight --      Height --      Head Circumference --      Peak Flow --      Pain Score 04/25/21 1501 6     Pain Loc --      Pain Edu? --      Excl. in GC? --    No data found.  Updated Vital Signs BP 121/81 (BP Location: Right Arm)   Pulse 93   Temp 98.3 F (36.8 C) (Tympanic)   Resp 16   SpO2 97%   Visual Acuity Right Eye Distance:   Left Eye Distance:   Bilateral Distance:    Right Eye Near:   Left Eye Near:    Bilateral Near:     Physical Exam Constitutional: Patient appears without distress, cooperative, alert Eyes: Conjunctivae and EOM are normal. PERRLA, no scleral icterus. Neck: Normal ROM. Neck supple.  CVS: RRR, S1/S2 +, no murmurs, no gallops, no carotid bruit.   Pulmonary: Effort and breath sounds normal, no stridor, rhonchi, wheezes, rales.  Abdominal: Soft. BS increased , no distension, tenderness, rebound or guarding.  Musculoskeletal: Normal range of motion. No edema and no tenderness.  Neuro: Alert.  No obvious focal neurological abnormalities present on exam Skin: Skin is warm and dry. No rash noted. Not diaphoretic. No erythema. No pallor. Psychiatric: Normal mood and affect. Behavior, judgment, thought content normal.   UC Treatments / Results  Labs (all labs ordered are listed, but only abnormal results are displayed) Labs Reviewed  URINE CULTURE - Abnormal; Notable for the following components:      Result Value   Culture MULTIPLE SPECIES PRESENT, SUGGEST RECOLLECTION (*)    All other components within normal limits  POCT URINALYSIS DIP (MANUAL ENTRY) - Abnormal; Notable for the following components:   Spec Grav, UA >=1.030 (*)    Blood, UA small (*)    Nitrite, UA Positive (*)    Leukocytes, UA Trace (*)    All other components within normal limits    EKG   Radiology No results found.  Procedures Procedures (including critical care time)  Medications Ordered in UC Medications - No data to display  Initial Impression / Assessment and Plan / UC Course  I have reviewed the triage vital signs and the nursing notes.  Pertinent labs & imaging results that were available during my care of the patient were reviewed by me and considered in my medical decision making (see chart for details).    Suspect GI symptoms is related to acute gastritis related to food and alcohol intake.  Symptom management with Zofran along with famotidine. Discussed red flags which warrant emergent evaluation in the setting of the ER. UA abnormal with positive nitrates and a trace of leuks suspect possible contamination as a source of abnormal UA given patient is asymptomatic.  Urine culture pending to rule out active infection. Return as  needed Final Clinical Impressions(s) / UC Diagnoses   Final diagnoses:  Acute gastritis without hemorrhage, unspecified gastritis type  Nausea and vomiting, unspecified vomiting type   Discharge Instructions   None    ED Prescriptions     Medication Sig Dispense Auth. Provider   ondansetron (ZOFRAN) 4 MG tablet Take 1 tablet (4 mg total) by mouth every 6 (six) hours. 12 tablet Bing Neighbors, FNP   famotidine (PEPCID) 20 MG tablet Take 1 tablet (20 mg total) by mouth 2 (two) times daily for 7 days. 14 tablet Bing Neighbors, FNP      PDMP not reviewed this encounter.   Bing Neighbors, FNP 04/29/21 1215

## 2021-04-27 LAB — URINE CULTURE: Special Requests: NORMAL

## 2021-04-28 ENCOUNTER — Telehealth (HOSPITAL_COMMUNITY): Payer: Self-pay | Admitting: Emergency Medicine

## 2021-04-28 ENCOUNTER — Telehealth: Payer: Self-pay | Admitting: Emergency Medicine

## 2021-04-28 NOTE — Telephone Encounter (Signed)
Patient called after seeing multiple species result on urine culture.  Encouraged her to return to new sample if symptoms are persisting.  She chooses to do so, will notify staff on site

## 2021-05-24 ENCOUNTER — Ambulatory Visit
Admission: EM | Admit: 2021-05-24 | Discharge: 2021-05-24 | Disposition: A | Discharge status: Home / Self Care | Payer: Federal, State, Local not specified - PPO | Attending: Family Medicine | Admitting: Family Medicine

## 2021-05-24 ENCOUNTER — Other Ambulatory Visit: Payer: Self-pay

## 2021-05-24 DIAGNOSIS — K047 Periapical abscess without sinus: Secondary | ICD-10-CM | POA: Diagnosis not present

## 2021-05-24 DIAGNOSIS — Z20828 Contact with and (suspected) exposure to other viral communicable diseases: Secondary | ICD-10-CM | POA: Diagnosis not present

## 2021-05-24 DIAGNOSIS — Z20822 Contact with and (suspected) exposure to covid-19: Secondary | ICD-10-CM | POA: Diagnosis not present

## 2021-05-24 DIAGNOSIS — J069 Acute upper respiratory infection, unspecified: Secondary | ICD-10-CM

## 2021-05-24 MED ORDER — LIDOCAINE VISCOUS HCL 2 % MT SOLN: 10.0000 mL | OROMUCOSAL | 0 refills | Status: DC | PRN

## 2021-05-24 MED ORDER — AMOXICILLIN-POT CLAVULANATE 875-125 MG PO TABS: 1.0000 | ORAL_TABLET | Freq: Two times a day (BID) | ORAL | 0 refills | Status: DC

## 2021-05-24 MED ORDER — MOLNUPIRAVIR EUA 200MG CAPSULE: 4.0000 | ORAL_CAPSULE | Freq: Two times a day (BID) | ORAL | 0 refills | Status: AC

## 2021-05-24 MED ORDER — PROMETHAZINE-DM 6.25-15 MG/5ML PO SYRP: 5.0000 mL | ORAL_SOLUTION | Freq: Four times a day (QID) | ORAL | 0 refills | Status: DC | PRN

## 2021-05-24 NOTE — ED Provider Notes (Signed)
[facility]    CSN: [id number] Arrival date & time: 05/24/21  1212      History   Chief Complaint Chief Complaint  Patient presents with   Sore Throat    Headache, sore throat and weakness    HPI Katelyn Hill is a 40 y.o. female.   Presenting today with 3-day history of scratchy throat, cough, headache, fever, chills, body aches.  Denies chest pain, shortness of breath, abdominal pain, nausea vomiting or diarrhea.  Recent exposure to daughter who has COVID.  No known history of chronic pulmonary disease.  She is taking ibuprofen for symptoms with minimal temporary relief.  She also has been having right lower facial swelling, dental pain and a broken tooth for the past several days.  Has some leftover amoxicillin for which she took 4 doses but states that it did not help much.   Past Medical History:  Diagnosis Date   Chronic pain    Menorrhagia    Migraine    Psoriasis     Patient Active Problem List   Diagnosis Date Noted   Psoriasis 01/22/2021   Smoker 08/19/2014   Female pelvic pain 08/19/2014   Family history of breast cancer in mother 08/19/2014    Past Surgical History:  Procedure Laterality Date   CERVICAL SPINE SURGERY     CESAREAN SECTION     HEAD & NECK WOUND REPAIR / CLOSURE     TUBAL LIGATION      OB History     Gravida  4   Para  4   Term  4   Preterm      AB      Living  4      SAB      IAB      Ectopic      Multiple      Live Births               Home Medications    Prior to Admission medications   Medication Sig Start Date End Date Taking? Authorizing Provider  amoxicillin-clavulanate (AUGMENTIN) 875-125 MG tablet Take 1 tablet by mouth every 12 (twelve) hours. 05/24/21  Yes Particia Nearing, PA-C  lidocaine (XYLOCAINE) 2 % solution Use as directed 10 mLs in the mouth or throat every 3 (three) hours as needed for mouth pain. 05/24/21  Yes Particia Nearing, PA-C  molnupiravir EUA  (LAGEVRIO) 200 mg CAPS capsule Take 4 capsules (800 mg total) by mouth 2 (two) times daily for 5 days. 05/24/21 05/29/21 Yes Particia Nearing, PA-C  promethazine-dextromethorphan (PROMETHAZINE-DM) 6.25-15 MG/5ML syrup Take 5 mLs by mouth 4 (four) times daily as needed. 05/24/21  Yes Particia Nearing, PA-C  clobetasol ointment (TEMOVATE) 0.05 % Apply topically 2 (two) times daily. 01/10/20   [provider]  famotidine (PEPCID) 20 MG tablet Take 1 tablet (20 mg total) by mouth 2 (two) times daily for 7 days. 04/25/21 05/02/21  Bing Neighbors, FNP  ferrous sulfate 325 (65 FE) MG tablet Take 1 tablet (325 mg total) by mouth daily. 05/16/20   Triplett, Tammy, PA-C  ibuprofen (ADVIL) 100 MG/5ML suspension Take 200 mg by mouth every 4 (four) hours as needed.    [provider]  ondansetron (ZOFRAN) 4 MG tablet Take 1 tablet (4 mg total) by mouth every 6 (six) hours. 04/25/21   Bing Neighbors, FNP  tiZANidine (ZANAFLEX) 4 MG tablet Take 1 tablet (4 mg total) by mouth at bedtime. 02/13/21  Bing Neighbors, FNP    Family History Family History  Problem Relation Age of Onset   Cancer Mother 63       Breast   Cancer Paternal Grandmother    Cancer Paternal Grandfather    Cancer Maternal Aunt        breast    Social History Social History   Tobacco Use   Smoking status: Every Day    Packs/day: 1.00    Years: 19.00    Pack years: 19.00    Types: Cigarettes   Smokeless tobacco: Never  Vaping Use   Vaping Use: Never used  Substance Use Topics   Alcohol use: Yes    Comment: occas   Drug use: No     Allergies   Tylenol [acetaminophen]   Review of Systems Review of Systems Per HPI  Physical Exam Triage Vital Signs ED Triage Vitals  Enc Vitals Group     BP 05/24/21 1553 125/72     Pulse Rate 05/24/21 1553 90     Resp 05/24/21 1553 16     Temp 05/24/21 1553 98.3 F (36.8 C)     Temp Source 05/24/21 1553 Oral     SpO2 05/24/21 1553 97 %      Weight --      Height --      Head Circumference --      Peak Flow --      Pain Score 05/24/21 1551 4     Pain Loc --      Pain Edu? --      Excl. in GC? --    No data found.  Updated Vital Signs BP 125/72 (BP Location: Right Arm)    Pulse 90    Temp 98.3 F (36.8 C) (Oral)    Resp 16    SpO2 97%   Visual Acuity Right Eye Distance:   Left Eye Distance:   Bilateral Distance:    Right Eye Near:   Left Eye Near:    Bilateral Near:     Physical Exam Vitals and nursing note reviewed.  Constitutional:      Appearance: Normal appearance. She is not ill-appearing.  HENT:     Head: Atraumatic.     Right Ear: Tympanic membrane normal.     Left Ear: Tympanic membrane normal.     Nose: Rhinorrhea present.     Mouth/Throat:     Mouth: Mucous membranes are moist.     Pharynx: Posterior oropharyngeal erythema present. No oropharyngeal exudate.     Comments: Gingival erythema, edema in the area of broken tooth right lower jaw, facial swelling in this area Eyes:     Extraocular Movements: Extraocular movements intact.     Conjunctiva/sclera: Conjunctivae normal.  Cardiovascular:     Rate and Rhythm: Normal rate and regular rhythm.     Heart sounds: Normal heart sounds.  Pulmonary:     Effort: Pulmonary effort is normal.     Breath sounds: Normal breath sounds. No wheezing or rales.  Musculoskeletal:        General: Normal range of motion.     Cervical back: Normal range of motion and neck supple.  Skin:    General: Skin is warm and dry.  Neurological:     Mental Status: She is alert and oriented to person, place, and time.  Psychiatric:        Mood and Affect: Mood normal.        Thought Content: Thought content normal.  Judgment: Judgment normal.     UC Treatments / Results  Labs (all labs ordered are listed, but only abnormal results are displayed) Labs Reviewed  COVID-19, FLU A+B NAA    EKG   Radiology No results found.  Procedures Procedures  (including critical care time)  Medications Ordered in UC Medications - No data to display  Initial Impression / Assessment and Plan / UC Course  I have reviewed the triage vital signs and the nursing notes.  Pertinent labs & imaging results that were available during my care of the patient were reviewed by me and considered in my medical decision making (see chart for details).     Given her symptoms and exposure to COVID, I will treat with molnupiravir, Phenergan DM, over-the-counter supportive medications and home care.  For dental infection, will treat with Augmentin, viscous lidocaine.  Return for acutely worsening symptoms.  Follow-up with dentist as soon as possible.  Final Clinical Impressions(s) / UC Diagnoses   Final diagnoses:  Exposure to the flu  Exposure to COVID-19 virus  Viral URI with cough  Dental infection   Discharge Instructions   None    ED Prescriptions     Medication Sig Dispense Auth. Provider   amoxicillin-clavulanate (AUGMENTIN) 875-125 MG tablet Take 1 tablet by mouth every 12 (twelve) hours. 14 tablet Particia Nearing, [place]   lidocaine (XYLOCAINE) 2 % solution Use as directed 10 mLs in the mouth or throat every 3 (three) hours as needed for mouth pain. 100 mL Particia Nearing, PA-C   promethazine-dextromethorphan (PROMETHAZINE-DM) 6.25-15 MG/5ML syrup Take 5 mLs by mouth 4 (four) times daily as needed. 100 mL Particia Nearing, PA-C   molnupiravir EUA (LAGEVRIO) 200 mg CAPS capsule Take 4 capsules (800 mg total) by mouth 2 (two) times daily for 5 days. 40 capsule Particia Nearing, [place]      PDMP not reviewed this encounter.   Particia Nearing, [place] 05/24/21 1624

## 2021-05-24 NOTE — ED Triage Notes (Signed)
Patient states that her daughter was diagnosed with Covid last Thursday and now she thinks she has it.   She states she is having a runny nose everyday and her throat is scratchy.   She states she has no energy.  She states she took Ibuprofen and Amoxicillin 500 mg about 8 pills but they did not help.   Patient states she has an abscess on the bottom right side of mouth.   Denies Fever.

## 2021-05-26 LAB — COVID-19, FLU A+B NAA
Influenza A, NAA: NOT DETECTED
Influenza B, NAA: NOT DETECTED
SARS-CoV-2, NAA: NOT DETECTED

## 2021-09-01 ENCOUNTER — Ambulatory Visit
Admission: EM | Admit: 2021-09-01 | Discharge: 2021-09-01 | Disposition: A | Discharge status: Home / Self Care | Payer: Federal, State, Local not specified - PPO | Attending: Urgent Care | Admitting: Urgent Care

## 2021-09-01 DIAGNOSIS — J069 Acute upper respiratory infection, unspecified: Secondary | ICD-10-CM

## 2021-09-01 DIAGNOSIS — R052 Subacute cough: Secondary | ICD-10-CM | POA: Diagnosis not present

## 2021-09-01 DIAGNOSIS — F17200 Nicotine dependence, unspecified, uncomplicated: Secondary | ICD-10-CM

## 2021-09-01 MED ORDER — PROMETHAZINE-DM 6.25-15 MG/5ML PO SYRP
5.0000 mL | ORAL_SOLUTION | Freq: Four times a day (QID) | ORAL | 0 refills | Status: DC | PRN
Start: 2021-09-01 — End: 2022-04-26

## 2021-09-01 MED ORDER — PREDNISONE 50 MG PO TABS: 50.0000 mg | ORAL_TABLET | Freq: Every day | ORAL | 0 refills | Status: DC

## 2021-09-01 MED ORDER — LEVOCETIRIZINE DIHYDROCHLORIDE 5 MG PO TABS: 5.0000 mg | ORAL_TABLET | Freq: Every evening | ORAL | 0 refills | Status: DC

## 2021-09-01 NOTE — ED Provider Notes (Signed)
?[facility] ? ? ?MRN: [id number] DOB: 12/07/1980 ? ?Subjective:  ? ?Katelyn Hill is a 41 y.o. female presenting for 5-6 day history of acute onset dry hacking cough, sinus congestion, shortness of breath with coughing fits.  Suspects that she may have asthma but has never been worked up for it.  She is a heavy smoker, does over a pack per day.  Underwent a course of antibiotics with Augmentin December 2022.  Had exposure to COVID-19 two weeks ago.  She did a COVID test that was negative. ? ?No current facility-administered medications for this encounter. ? ?Current Outpatient Medications:  ?  amoxicillin-clavulanate (AUGMENTIN) 875-125 MG tablet, Take 1 tablet by mouth every 12 (twelve) hours., Disp: 14 tablet, Rfl: 0 ?  clobetasol ointment (TEMOVATE) 0.05 %, Apply topically 2 (two) times daily., Disp: , Rfl:  ?  famotidine (PEPCID) 20 MG tablet, Take 1 tablet (20 mg total) by mouth 2 (two) times daily for 7 days., Disp: 14 tablet, Rfl: 0 ?  ferrous sulfate 325 (65 FE) MG tablet, Take 1 tablet (325 mg total) by mouth daily., Disp: 30 tablet, Rfl: 0 ?  ibuprofen (ADVIL) 100 MG/5ML suspension, Take 200 mg by mouth every 4 (four) hours as needed., Disp: , Rfl:  ?  lidocaine (XYLOCAINE) 2 % solution, Use as directed 10 mLs in the mouth or throat every 3 (three) hours as needed for mouth pain., Disp: 100 mL, Rfl: 0 ?  ondansetron (ZOFRAN) 4 MG tablet, Take 1 tablet (4 mg total) by mouth every 6 (six) hours., Disp: 12 tablet, Rfl: 0 ?  promethazine-dextromethorphan (PROMETHAZINE-DM) 6.25-15 MG/5ML syrup, Take 5 mLs by mouth 4 (four) times daily as needed., Disp: 100 mL, Rfl: 0 ?  tiZANidine (ZANAFLEX) 4 MG tablet, Take 1 tablet (4 mg total) by mouth at bedtime., Disp: 12 tablet, Rfl: 0  ? ?Allergies  ?Allergen Reactions  ? Tylenol [Acetaminophen] Other (See Comments)  ?  States, "will make my head hurt worse".  ? ? ?Past Medical History:  ?Diagnosis Date  ? Chronic pain   ? Menorrhagia   ? Migraine    ? Psoriasis   ?  ? ?Past Surgical History:  ?Procedure Laterality Date  ? CERVICAL SPINE SURGERY    ? CESAREAN SECTION    ? HEAD & NECK WOUND REPAIR / CLOSURE    ? TUBAL LIGATION    ? ? ?Family History  ?Problem Relation Age of Onset  ? Cancer Mother 61  ?     Breast  ? Cancer Paternal Grandmother   ? Cancer Paternal Grandfather   ? Cancer Maternal Aunt   ?     breast  ? ? ?Social History  ? ?Tobacco Use  ? Smoking status: Every Day  ?  Packs/day: 1.00  ?  Years: 19.00  ?  Pack years: 19.00  ?  Types: Cigarettes  ? Smokeless tobacco: Never  ?Vaping Use  ? Vaping Use: Never used  ?Substance Use Topics  ? Alcohol use: Yes  ?  Comment: occas  ? Drug use: No  ? ? ?ROS ? ? ?Objective:  ? ?Vitals: ?BP 110/63 (BP Location: Right Arm)   Pulse 83   Temp 98.1 ?F (36.7 ?C) (Oral)   Resp 18   LMP  (Within Weeks) Comment: 1 week  SpO2 98%  ? ?Physical Exam ?Constitutional:   ?   General: She is not in acute distress. ?   Appearance: Normal appearance. She is well-developed and normal weight. She is  not ill-appearing, toxic-appearing or diaphoretic.  ?HENT:  ?   Head: Normocephalic and atraumatic.  ?   Right Ear: Tympanic membrane, ear canal and external ear normal. No drainage or tenderness. No middle ear effusion. There is no impacted cerumen. Tympanic membrane is not erythematous.  ?   Left Ear: Tympanic membrane, ear canal and external ear normal. No drainage or tenderness.  No middle ear effusion. There is no impacted cerumen. Tympanic membrane is not erythematous.  ?   Nose: Nose normal. No congestion or rhinorrhea.  ?   Mouth/Throat:  ?   Mouth: Mucous membranes are moist. No oral lesions.  ?   Pharynx: No pharyngeal swelling, oropharyngeal exudate, posterior oropharyngeal erythema or uvula swelling.  ?   Tonsils: No tonsillar exudate or tonsillar abscesses.  ?Eyes:  ?   General: No scleral icterus.    ?   Right eye: No discharge.     ?   Left eye: No discharge.  ?   Extraocular Movements: Extraocular movements  intact.  ?   Right eye: Normal extraocular motion.  ?   Left eye: Normal extraocular motion.  ?   Conjunctiva/sclera: Conjunctivae normal.  ?Cardiovascular:  ?   Rate and Rhythm: Normal rate.  ?   Heart sounds: No murmur heard. ?  No friction rub. No gallop.  ?Pulmonary:  ?   Effort: Pulmonary effort is normal. No respiratory distress.  ?   Breath sounds: No stridor. No wheezing, rhonchi or rales.  ?Chest:  ?   Chest wall: No tenderness.  ?Musculoskeletal:  ?   Cervical back: Normal range of motion and neck supple.  ?Lymphadenopathy:  ?   Cervical: No cervical adenopathy.  ?Skin: ?   General: Skin is warm and dry.  ?Neurological:  ?   General: No focal deficit present.  ?   Mental Status: She is alert and oriented to person, place, and time.  ?Psychiatric:     ?   Mood and Affect: Mood normal.     ?   Behavior: Behavior normal.  ? ? ?Assessment and Plan :  ? ?PDMP not reviewed this encounter. ? ?1. Viral upper respiratory infection   ?2. Subacute cough   ?3. Smoker   ? ?In the context of the patient's smoking, recommended an oral prednisone course.  Deferred imaging given clear cardiopulmonary exam, hemodynamically stable vital signs. Suspect viral URI, viral syndrome. Physical exam findings reassuring and vital signs stable for discharge. Advised supportive care, offered symptomatic relief. Counseled patient on potential for adverse effects with medications prescribed/recommended today, ER and return-to-clinic precautions discussed, patient verbalized understanding.  ?  ?  ?Wallis Bamberg, PA-C ?09/01/21 1039 ? ?

## 2021-09-01 NOTE — ED Triage Notes (Signed)
Pt reports nasal congestion, cough x 5-6 days.  ? ?Pt reports she was exposed to COVID 2 weeks ago.  ? ?Reports negative COVID a test (home).  ?

## 2021-09-24 ENCOUNTER — Other Ambulatory Visit (HOSPITAL_COMMUNITY)
Admission: RE | Admit: 2021-09-24 | Discharge: 2021-09-24 | Disposition: A | Discharge status: Home / Self Care | Payer: Federal, State, Local not specified - PPO | Source: Ambulatory Visit | Attending: Obstetrics & Gynecology | Admitting: Obstetrics & Gynecology

## 2021-09-24 ENCOUNTER — Encounter: Payer: Self-pay | Admitting: Obstetrics & Gynecology

## 2021-09-24 ENCOUNTER — Ambulatory Visit (INDEPENDENT_AMBULATORY_CARE_PROVIDER_SITE_OTHER): Payer: Federal, State, Local not specified - PPO | Admitting: Obstetrics & Gynecology

## 2021-09-24 VITALS — BP 125/72 | HR 97 | Ht 61.0 in | Wt 222.0 lb

## 2021-09-24 DIAGNOSIS — N921 Excessive and frequent menstruation with irregular cycle: Secondary | ICD-10-CM | POA: Diagnosis not present

## 2021-09-24 DIAGNOSIS — Z1231 Encounter for screening mammogram for malignant neoplasm of breast: Secondary | ICD-10-CM | POA: Diagnosis not present

## 2021-09-24 DIAGNOSIS — Z124 Encounter for screening for malignant neoplasm of cervix: Secondary | ICD-10-CM

## 2021-09-24 LAB — POCT HEMOGLOBIN: Hemoglobin: 11 g/dL (ref 11–14.6)

## 2021-09-24 MED ORDER — MEGESTROL ACETATE 40 MG PO TABS: ORAL_TABLET | ORAL | 3 refills | Status: DC

## 2021-09-24 NOTE — Progress Notes (Signed)
Chief Complaint  Patient presents with   Menorrhagia      41 y.o. [id number] Patient's last menstrual period was 07/24/2021. The current method of family planning is tubal ligation.  Outpatient Encounter Medications as of 09/24/2021  Medication Sig   megestrol (MEGACE) 40 MG tablet 3 tablets a day for 5 days, 2 tablets a day for 5 days then 1 tablet daily (Patient not taking: Reported on 11/12/2021)   predniSONE (DELTASONE) 50 MG tablet Take 1 tablet (50 mg total) by mouth daily with breakfast. (Patient not taking: Reported on 11/12/2021)   amoxicillin-clavulanate (AUGMENTIN) 875-125 MG tablet Take 1 tablet by mouth every 12 (twelve) hours. (Patient not taking: Reported on 09/24/2021)   clobetasol ointment (TEMOVATE) 0.05 % Apply topically 2 (two) times daily.   famotidine (PEPCID) 20 MG tablet Take 1 tablet (20 mg total) by mouth 2 (two) times daily for 7 days.   ferrous sulfate 325 (65 FE) MG tablet Take 1 tablet (325 mg total) by mouth daily. (Patient not taking: Reported on 09/24/2021)   levocetirizine (XYZAL) 5 MG tablet Take 1 tablet (5 mg total) by mouth every evening. (Patient not taking: Reported on 09/24/2021)   lidocaine (XYLOCAINE) 2 % solution Use as directed 10 mLs in the mouth or throat every 3 (three) hours as needed for mouth pain. (Patient not taking: Reported on 09/24/2021)   ondansetron (ZOFRAN) 4 MG tablet Take 1 tablet (4 mg total) by mouth every 6 (six) hours. (Patient not taking: Reported on 09/24/2021)   promethazine-dextromethorphan (PROMETHAZINE-DM) 6.25-15 MG/5ML syrup Take 5 mLs by mouth 4 (four) times daily as needed for cough. (Patient not taking: Reported on 09/24/2021)   tiZANidine (ZANAFLEX) 4 MG tablet Take 1 tablet (4 mg total) by mouth at bedtime. (Patient not taking: Reported on 09/24/2021)   [DISCONTINUED] ibuprofen (ADVIL) 100 MG/5ML suspension Take 200 mg by mouth every 4 (four) hours as needed. (Patient not taking: Reported on 09/24/2021)   No  facility-administered encounter medications on file as of 09/24/2021.    Subjective Pt with very heavy cycles Clots Soils Cramping worsening Tampons + pads  Past Medical History:  Diagnosis Date   Chronic pain    Menorrhagia    Migraine    Psoriasis     Past Surgical History:  Procedure Laterality Date   CERVICAL SPINE SURGERY     CESAREAN SECTION     HEAD & NECK WOUND REPAIR / CLOSURE     TUBAL LIGATION      OB History     Gravida  4   Para  4   Term  4   Preterm      AB      Living  4      SAB      IAB      Ectopic      Multiple      Live Births              Allergies  Allergen Reactions   Tylenol [Acetaminophen] Other (See Comments)    States, "will make my head hurt worse".    Social History   Socioeconomic History   Marital status: Married    Spouse name: Not on file   Number of children: Not on file   Years of education: Not on file   Highest education level: Not on file  Occupational History   Not on file  Tobacco Use   Smoking status: Every Day    Packs/day: 1.00  Years: 19.00    Total pack years: 19.00    Types: Cigarettes   Smokeless tobacco: Never  Vaping Use   Vaping Use: Never used  Substance and Sexual Activity   Alcohol use: Yes    Comment: occas   Drug use: No   Sexual activity: Yes    Birth control/protection: Surgical  Other Topics Concern   Not on file  Social History Narrative   Not on file   Social Determinants of Health   Financial Resource Strain: Not on file  Food Insecurity: Food Insecurity Present (09/24/2021)   Hunger Vital Sign    Worried About Running Out of Food in the Last Year: Sometimes true    Ran Out of Food in the Last Year: Never true  Transportation Needs: Not on file  Physical Activity: Not on file  Stress: Not on file  Social Connections: Not on file    Family History  Problem Relation Age of Onset   Cancer Mother [id number]       Breast   Cancer Paternal Grandmother     Cancer Paternal Grandfather    Cancer Maternal Aunt        breast    Medications:       Current Outpatient Medications:    megestrol (MEGACE) 40 MG tablet, 3 tablets a day for 5 days, 2 tablets a day for 5 days then 1 tablet daily (Patient not taking: Reported on 11/12/2021), Disp: 45 tablet, Rfl: 3   predniSONE (DELTASONE) 50 MG tablet, Take 1 tablet (50 mg total) by mouth daily with breakfast. (Patient not taking: Reported on 11/12/2021), Disp: 5 tablet, Rfl: 0   amoxicillin-clavulanate (AUGMENTIN) 875-125 MG tablet, Take 1 tablet by mouth every 12 (twelve) hours. (Patient not taking: Reported on 09/24/2021), Disp: 14 tablet, Rfl: 0   clobetasol ointment (TEMOVATE) 0.05 %, Apply topically 2 (two) times daily., Disp: , Rfl:    famotidine (PEPCID) 20 MG tablet, Take 1 tablet (20 mg total) by mouth 2 (two) times daily for 7 days., Disp: 14 tablet, Rfl: 0   ferrous sulfate 325 (65 FE) MG tablet, Take 1 tablet (325 mg total) by mouth daily. (Patient not taking: Reported on 09/24/2021), Disp: 30 tablet, Rfl: 0   levocetirizine (XYZAL) 5 MG tablet, Take 1 tablet (5 mg total) by mouth every evening. (Patient not taking: Reported on 09/24/2021), Disp: 90 tablet, Rfl: 0   lidocaine (XYLOCAINE) 2 % solution, Use as directed 10 mLs in the mouth or throat every 3 (three) hours as needed for mouth pain. (Patient not taking: Reported on 09/24/2021), Disp: 100 mL, Rfl: 0   ondansetron (ZOFRAN) 4 MG tablet, Take 1 tablet (4 mg total) by mouth every 6 (six) hours. (Patient not taking: Reported on 09/24/2021), Disp: 12 tablet, Rfl: 0   promethazine-dextromethorphan (PROMETHAZINE-DM) 6.25-15 MG/5ML syrup, Take 5 mLs by mouth 4 (four) times daily as needed for cough. (Patient not taking: Reported on 09/24/2021), Disp: 200 mL, Rfl: 0   tiZANidine (ZANAFLEX) 4 MG tablet, Take 1 tablet (4 mg total) by mouth at bedtime. (Patient not taking: Reported on 09/24/2021), Disp: 12 tablet, Rfl: 0  Objective Blood pressure 125/72, pulse  97, height 5\' 1"  (1.549 m), weight 222 lb (100.7 kg), last menstrual period 07/24/2021.  General [facility] female NAD Vulva:  normal appearing vulva with no masses, tenderness or lesions Vagina:  normal mucosa, no discharge Cervix:  Normal no lesions Uterus:  normal size, contour, position, consistency, mobility, non-tender Adnexa: ovaries:present,  normal adnexa in  size, nontender and no masses    Pertinent ROS No burning with urination, frequency or urgency No nausea, vomiting or diarrhea Nor fever chills or other constitutional symptoms   Labs or studies Reviewed     Impression + Management Plan: Diagnoses this Encounter::   ICD-10-CM   1. Menorrhagia with irregular cycle  N92.1 POCT hemoglobin    CANCELED: US PELVIS (TRANSABDOMINAL ONLY)    2. Routine cervical smear  Z12.4 Cytology - PAP( [facility])    3. Breast cancer screening by mammogram  Z12.31 CANCELED: MM 3D SCREEN BREAST BILATERAL        Medications prescribed during  this encounter: Meds ordered this encounter  Medications   megestrol (MEGACE) 40 MG tablet    Sig: 3 tablets a day for 5 days, 2 tablets a day for 5 days then 1 tablet daily    Dispense:  45 tablet    Refill:  3    Labs or Scans Ordered during this encounter: Orders Placed This Encounter  Procedures   POCT hemoglobin      Follow up Return in about 6 weeks (around 11/05/2021) for GYN sono, Follow up, with Dr Despina Hidden.

## 2021-09-30 LAB — CYTOLOGY - PAP
Chlamydia: NEGATIVE
Comment: NEGATIVE
Comment: NEGATIVE
Comment: NORMAL
Diagnosis: NEGATIVE
Diagnosis: REACTIVE
High risk HPV: NEGATIVE
Neisseria Gonorrhea: NEGATIVE

## 2021-10-21 ENCOUNTER — Encounter (INDEPENDENT_AMBULATORY_CARE_PROVIDER_SITE_OTHER): Payer: Self-pay | Admitting: *Deleted

## 2021-10-21 ENCOUNTER — Other Ambulatory Visit: Payer: Federal, State, Local not specified - PPO | Admitting: Advanced Practice Midwife

## 2021-11-05 ENCOUNTER — Ambulatory Visit: Payer: Federal, State, Local not specified - PPO | Admitting: Obstetrics & Gynecology

## 2021-11-05 ENCOUNTER — Other Ambulatory Visit: Payer: Federal, State, Local not specified - PPO

## 2021-11-12 ENCOUNTER — Emergency Department (HOSPITAL_COMMUNITY)
Admission: EM | Admit: 2021-11-12 | Discharge: 2021-11-12 | Disposition: A | Discharge status: Home / Self Care | Payer: Federal, State, Local not specified - PPO | Attending: Emergency Medicine | Admitting: Emergency Medicine

## 2021-11-12 ENCOUNTER — Encounter (HOSPITAL_COMMUNITY): Payer: Self-pay

## 2021-11-12 ENCOUNTER — Emergency Department (HOSPITAL_COMMUNITY): Payer: Federal, State, Local not specified - PPO

## 2021-11-12 ENCOUNTER — Other Ambulatory Visit: Payer: Self-pay

## 2021-11-12 DIAGNOSIS — R1032 Left lower quadrant pain: Secondary | ICD-10-CM | POA: Insufficient documentation

## 2021-11-12 DIAGNOSIS — R1084 Generalized abdominal pain: Secondary | ICD-10-CM

## 2021-11-12 DIAGNOSIS — K820 Obstruction of gallbladder: Secondary | ICD-10-CM | POA: Diagnosis not present

## 2021-11-12 DIAGNOSIS — N281 Cyst of kidney, acquired: Secondary | ICD-10-CM | POA: Diagnosis not present

## 2021-11-12 LAB — COMPREHENSIVE METABOLIC PANEL
ALT: 13 U/L (ref 0–44)
AST: 21 U/L (ref 15–41)
Albumin: 3.7 g/dL (ref 3.5–5.0)
Alkaline Phosphatase: 74 U/L (ref 38–126)
Anion gap: 8 (ref 5–15)
BUN: 15 mg/dL (ref 6–20)
CO2: 24 mmol/L (ref 22–32)
Calcium: 8.4 mg/dL — ABNORMAL LOW (ref 8.9–10.3)
Chloride: 103 mmol/L (ref 98–111)
Creatinine, Ser: 0.62 mg/dL (ref 0.44–1.00)
GFR, Estimated: >60 mL/min (ref >60)
Glucose, Bld: 104 mg/dL — ABNORMAL HIGH (ref 70–99)
Potassium: 3.9 mmol/L (ref 3.5–5.1)
Sodium: 135 mmol/L (ref 135–145)
Total Bilirubin: 0.4 mg/dL (ref 0.3–1.2)
Total Protein: 6.7 g/dL (ref 6.5–8.1)

## 2021-11-12 LAB — URINALYSIS, ROUTINE W REFLEX MICROSCOPIC
Bilirubin Urine: NEGATIVE
Glucose, UA: NEGATIVE mg/dL
Ketones, ur: NEGATIVE mg/dL
Nitrite: NEGATIVE
Protein, ur: NEGATIVE mg/dL
Specific Gravity, Urine: 1.02 (ref 1.005–1.030)
pH: 6 (ref 5.0–8.0)

## 2021-11-12 LAB — CBC WITH DIFFERENTIAL/PLATELET
Abs Immature Granulocytes: 0.03 10*3/uL (ref 0.00–0.07)
Basophils Absolute: 0.1 10*3/uL (ref 0.0–0.1)
Basophils Relative: 1 %
Eosinophils Absolute: 0.1 10*3/uL (ref 0.0–0.5)
Eosinophils Relative: 1 %
HCT: 34.3 % — ABNORMAL LOW (ref 36.0–46.0)
Hemoglobin: 10 g/dL — ABNORMAL LOW (ref 12.0–15.0)
Immature Granulocytes: 0 %
Lymphocytes Relative: 25 %
Lymphs Abs: 2.3 10*3/uL (ref 0.7–4.0)
MCH: 20.4 pg — ABNORMAL LOW (ref 26.0–34.0)
MCHC: 29.2 g/dL — ABNORMAL LOW (ref 30.0–36.0)
MCV: 69.9 fL — ABNORMAL LOW (ref 80.0–100.0)
Monocytes Absolute: 0.7 10*3/uL (ref 0.1–1.0)
Monocytes Relative: 7 %
Neutro Abs: 5.9 10*3/uL (ref 1.7–7.7)
Neutrophils Relative %: 66 %
Platelets: 250 10*3/uL (ref 150–400)
RBC: 4.91 MIL/uL (ref 3.87–5.11)
RDW: 18.3 % — ABNORMAL HIGH (ref 11.5–15.5)
WBC: 9 10*3/uL (ref 4.0–10.5)
nRBC: 0 % (ref 0.0–0.2)

## 2021-11-12 MED ORDER — IOHEXOL 300 MG/ML  SOLN
100.0000 mL | Freq: Once | INTRAMUSCULAR | Status: AC | PRN
  Administered 2021-11-12: 100 mL via INTRAVENOUS

## 2021-11-12 NOTE — ED Notes (Signed)
Unable to get lab work

## 2021-11-12 NOTE — ED Provider Notes (Signed)
[facility] EMERGENCY DEPARTMENT  Provider Note  CSN: [id number] Arrival date & time: 11/12/21 0546  History Chief Complaint  Patient presents with   Abdominal Pain    Katelyn Hill is a 41 y.o. female reports left sided abdominal pain and intermittent constipation for a few weeks, she has been having BM which are not hard, but she has a sensation of incomplete voiding and associated with LLQ abdominal pains. No vomiting or fever. No dysuria. She has not been to see her PCP because she overslept and missed her appointment. She had an uncle recently die from colon cancer and is concerned she has the same.    Home Medications Prior to Admission medications   Medication Sig Start Date End Date Taking? Authorizing Provider  amoxicillin-clavulanate (AUGMENTIN) 875-125 MG tablet Take 1 tablet by mouth every 12 (twelve) hours. Patient not taking: Reported on 09/24/2021 05/24/21   Particia Nearing, PA-C  clobetasol ointment (TEMOVATE) 0.05 % Apply topically 2 (two) times daily. Patient not taking: Reported on 09/24/2021 01/10/20   [provider]  famotidine (PEPCID) 20 MG tablet Take 1 tablet (20 mg total) by mouth 2 (two) times daily for 7 days. 04/25/21 05/02/21  Bing Neighbors, FNP  ferrous sulfate 325 (65 FE) MG tablet Take 1 tablet (325 mg total) by mouth daily. Patient not taking: Reported on 09/24/2021 05/16/20   Pauline Aus, PA-C  ibuprofen (ADVIL) 100 MG/5ML suspension Take 200 mg by mouth every 4 (four) hours as needed. Patient not taking: Reported on 09/24/2021    [provider]  levocetirizine (XYZAL) 5 MG tablet Take 1 tablet (5 mg total) by mouth every evening. Patient not taking: Reported on 09/24/2021 09/01/21   Wallis Bamberg, PA-C  lidocaine (XYLOCAINE) 2 % solution Use as directed 10 mLs in the mouth or throat every 3 (three) hours as needed for mouth pain. Patient not taking: Reported on 09/24/2021 05/24/21   Particia Nearing, PA-C  megestrol  (MEGACE) 40 MG tablet 3 tablets a day for 5 days, 2 tablets a day for 5 days then 1 tablet daily 09/24/21   Lazaro Arms, MD  ondansetron (ZOFRAN) 4 MG tablet Take 1 tablet (4 mg total) by mouth every 6 (six) hours. Patient not taking: Reported on 09/24/2021 04/25/21   Bing Neighbors, FNP  predniSONE (DELTASONE) 50 MG tablet Take 1 tablet (50 mg total) by mouth daily with breakfast. 09/01/21   Wallis Bamberg, PA-C  promethazine-dextromethorphan (PROMETHAZINE-DM) 6.25-15 MG/5ML syrup Take 5 mLs by mouth 4 (four) times daily as needed for cough. Patient not taking: Reported on 09/24/2021 09/01/21   Wallis Bamberg, PA-C  tiZANidine (ZANAFLEX) 4 MG tablet Take 1 tablet (4 mg total) by mouth at bedtime. Patient not taking: Reported on 09/24/2021 02/13/21   Bing Neighbors, FNP     Allergies    Tylenol [acetaminophen]   Review of Systems   Review of Systems Please see HPI for pertinent positives and negatives  Physical Exam BP 136/70   Pulse 83   Temp 98.1 F (36.7 C)   Resp 17   Ht 5\' 1"  (1.549 m)   Wt 102.1 kg   SpO2 99%   BMI 42.51 kg/m   Physical Exam Vitals and nursing note reviewed.  Constitutional:      Appearance: Normal appearance.  HENT:     Head: Normocephalic and atraumatic.     Nose: Nose normal.     Mouth/Throat:     Mouth: Mucous membranes are  moist.  Eyes:     Extraocular Movements: Extraocular movements intact.     Conjunctiva/sclera: Conjunctivae normal.  Cardiovascular:     Rate and Rhythm: Normal rate.  Pulmonary:     Effort: Pulmonary effort is normal.     Breath sounds: Normal breath sounds.  Abdominal:     General: Abdomen is flat.     Palpations: Abdomen is soft.     Tenderness: There is abdominal tenderness (LLQ). There is no guarding.  Musculoskeletal:        General: No swelling. Normal range of motion.     Cervical back: Neck supple.  Skin:    General: Skin is warm and dry.  Neurological:     General: No focal deficit present.     Mental  Status: She is alert.  Psychiatric:        Mood and Affect: Mood normal.     ED Results / Procedures / Treatments   EKG None  Procedures Procedures  Medications Ordered in the ED Medications - No data to display  Initial Impression and Plan  Patient here with LLQ abdominal pain, some sensation of constipation but more of an incomplete voiding. She has some tenderness in LLQ, but does not have any peritoneal signs. Given duration of her symptoms without workup and to address her specific concerns of cancer, will check basic labs and send for CT. She is aware that this is not a comprehensive evaluation and she will still need outpatient follow up.   ED Course   Clinical Course as of 11/12/21 7253  Fri Nov 12, 2021  [id number] Care will be signed out to the oncoming team at the change of shift.   [CS]    Clinical Course User Index [CS] Pollyann Savoy, MD     MDM Rules/Calculators/A&P Medical Decision Making Problems Addressed: LLQ abdominal pain: acute illness or injury  Amount and/or Complexity of Data Reviewed Labs: ordered. Radiology: ordered.    Final Clinical Impression(s) / ED Diagnoses Final diagnoses:  LLQ abdominal pain    Rx / DC Orders ED Discharge Orders     None        Pollyann Savoy, MD 11/12/21 [phone]

## 2021-11-12 NOTE — ED Notes (Signed)
Patient transported to CT 

## 2021-11-12 NOTE — ED Notes (Signed)
Gave pt gown to change into.

## 2021-11-12 NOTE — ED Triage Notes (Signed)
Pov from home cc of abdominal pain because she has been constipated for a month. Has not seen anyone for this. Tried stool softeners. "Not much help" thinks it made her  legs swollen.  Googled and thinks she has cancer.

## 2021-11-12 NOTE — ED Notes (Signed)
ED Provider at bedside. 

## 2021-11-12 NOTE — ED Provider Notes (Signed)
Patient signed out to me is pending CT imaging.  Imaging is unremarkable.  We will recommend outpatient follow-up with her primary care doctor.  Recommending immediate return for worsening symptoms or any additional concerns.   Cheryll Cockayne, MD 11/12/21 [phone]

## 2021-11-12 NOTE — Discharge Instructions (Signed)
Your labs and CT imaging were normal appearing.  Follow-up with your doctor this week.  Return if you have fevers worsening symptoms or additional concerns.

## 2021-11-24 ENCOUNTER — Other Ambulatory Visit: Payer: Federal, State, Local not specified - PPO

## 2021-11-25 ENCOUNTER — Ambulatory Visit: Payer: Federal, State, Local not specified - PPO | Admitting: Obstetrics & Gynecology

## 2021-12-24 ENCOUNTER — Other Ambulatory Visit: Payer: Self-pay | Admitting: Obstetrics & Gynecology

## 2021-12-24 DIAGNOSIS — R1032 Left lower quadrant pain: Secondary | ICD-10-CM

## 2021-12-27 ENCOUNTER — Other Ambulatory Visit: Payer: Federal, State, Local not specified - PPO

## 2021-12-27 ENCOUNTER — Ambulatory Visit: Payer: Federal, State, Local not specified - PPO | Admitting: Obstetrics & Gynecology

## 2022-04-04 ENCOUNTER — Encounter (INDEPENDENT_AMBULATORY_CARE_PROVIDER_SITE_OTHER): Payer: Self-pay | Admitting: *Deleted

## 2022-04-26 ENCOUNTER — Other Ambulatory Visit: Payer: Self-pay

## 2022-04-26 ENCOUNTER — Ambulatory Visit (INDEPENDENT_AMBULATORY_CARE_PROVIDER_SITE_OTHER): Payer: Federal, State, Local not specified - PPO

## 2022-04-26 ENCOUNTER — Ambulatory Visit
Admission: EM | Admit: 2022-04-26 | Discharge: 2022-04-26 | Disposition: A | Discharge status: Home / Self Care | Payer: Federal, State, Local not specified - PPO | Attending: Nurse Practitioner | Admitting: Nurse Practitioner

## 2022-04-26 ENCOUNTER — Encounter: Payer: Self-pay | Admitting: Emergency Medicine

## 2022-04-26 DIAGNOSIS — M545 Low back pain, unspecified: Secondary | ICD-10-CM | POA: Diagnosis not present

## 2022-04-26 DIAGNOSIS — M546 Pain in thoracic spine: Secondary | ICD-10-CM

## 2022-04-26 MED ORDER — TIZANIDINE HCL 4 MG PO TABS: 4.0000 mg | ORAL_TABLET | Freq: Three times a day (TID) | ORAL | 0 refills | Status: DC | PRN

## 2022-04-26 NOTE — ED Triage Notes (Addendum)
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 Pt reports jumped off of forklift but reports mid-to-lower back pain ever since with intermittent radiation to RLE.  Pt reports has filed paperwork with company but reports missed work x2 days and reports HR is out of office until Dec 1st.

## 2022-04-26 NOTE — Discharge Instructions (Signed)
The back x-ray today does not show any acute abnormalities.  In addition to ibuprofen, start muscle relaxant as needed for pain.  You can also use warm compresses and light stretching/range of motion exercises.  Seek care if symptoms persist or worsen despite treatment.

## 2022-04-26 NOTE — ED Provider Notes (Signed)
[facility]    CSN: [id number] Arrival date & time: 04/26/22  1331      History   Chief Complaint Chief Complaint  Patient presents with   Back Pain    HPI [name] is a 41 y.o. female.   Patient presents for back pain that started suddenly a couple of days ago.  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  Reports her back was jarred when this happened.  She immediately jumped out of the forklift onto solid ground.  Reports about 1 hour later, pain began in her back.  She describes the pain as moderate and sharp.  The pain is mostly on the sides of her spine.  Has been taking ibuprofen for the pain with minimal relief.  Denies numbness or tingling going down her leg, decreased sensation in her legs, saddle anesthesia, new bowel or bladder incontinence, fever, nausea/vomiting, and dysuria/urinary frequency.  Reports pain is worse when standing.  Reports she is now having pain in her right lower extremity as well.       Past Medical History:  Diagnosis Date   Chronic pain    Menorrhagia    Migraine    Psoriasis     Patient Active Problem List   Diagnosis Date Noted   Psoriasis 01/22/2021   Smoker 08/19/2014   Female pelvic pain 08/19/2014   Family history of breast cancer in mother 08/19/2014    Past Surgical History:  Procedure Laterality Date   CERVICAL SPINE SURGERY     CESAREAN SECTION     HEAD & NECK WOUND REPAIR / CLOSURE     TUBAL LIGATION      OB History     Gravida  4   Para  4   Term  4   Preterm      AB      Living  4      SAB      IAB      Ectopic      Multiple      Live Births               Home Medications    Prior to Admission medications   Medication Sig Start Date End Date Taking? Authorizing Provider  clobetasol ointment (TEMOVATE) 0.05 % Apply topically 2 (two) times daily. 01/10/20   [provider]   famotidine (PEPCID) 20 MG tablet Take 1 tablet (20 mg total) by mouth 2 (two) times daily for 7 days. 04/25/21 05/02/21  Bing Neighbors, FNP  ferrous sulfate 325 (65 FE) MG tablet Take 1 tablet (325 mg total) by mouth daily. Patient not taking: Reported on 09/24/2021 05/16/20   Triplett, Babette Relic, PA-C  levocetirizine (XYZAL) 5 MG tablet Take 1 tablet (5 mg total) by mouth every evening. Patient not taking: Reported on 09/24/2021 09/01/21   Wallis Bamberg, PA-C  lidocaine (XYLOCAINE) 2 % solution Use as directed 10 mLs in the mouth or throat every 3 (three) hours as needed for mouth pain. Patient not taking: Reported on 09/24/2021 05/24/21   Particia Nearing, PA-C  megestrol (MEGACE) 40 MG tablet 3 tablets a day for 5 days, 2 tablets a day for 5 days then 1 tablet daily Patient not taking: Reported on 11/12/2021 09/24/21   Lazaro Arms, MD  ondansetron (ZOFRAN) 4 MG tablet Take 1 tablet (4 mg total) by mouth every 6 (six)  hours. Patient not taking: Reported on 09/24/2021 04/25/21   Bing Neighbors, FNP  tiZANidine (ZANAFLEX) 4 MG tablet Take 1 tablet (4 mg total) by mouth every 8 (eight) hours as needed for muscle spasms. Do not take with alcohol or while driving or operating heavy machinery.  May cause drowsiness. 04/26/22   Valentino Nose, NP    Family History Family History  Problem Relation Age of Onset   Cancer Mother 12       Breast   Cancer Paternal Grandmother    Cancer Paternal Grandfather    Cancer Maternal Aunt        breast    Social History Social History   Tobacco Use   Smoking status: Every Day    Packs/day: 1.00    Years: 19.00    Total pack years: 19.00    Types: Cigarettes   Smokeless tobacco: Never  Vaping Use   Vaping Use: Never used  Substance Use Topics   Alcohol use: Yes    Comment: occas   Drug use: No     Allergies   Tylenol [acetaminophen]   Review of Systems Review of Systems Per HPI  Physical Exam Triage Vital Signs ED Triage  Vitals  Enc Vitals Group     BP 04/26/22 1516 (!) 138/97     Pulse Rate 04/26/22 1516 81     Resp 04/26/22 1516 20     Temp 04/26/22 1516 98.2 F (36.8 C)     Temp Source 04/26/22 1516 Oral     SpO2 04/26/22 1516 97 %     Weight --      Height --      Head Circumference --      Peak Flow --      Pain Score 04/26/22 1511 5     Pain Loc --      Pain Edu? --      Excl. in GC? --    No data found.  Updated Vital Signs BP (!) 138/97 (BP Location: Right Arm)   Pulse 81   Temp 98.2 F (36.8 C) (Oral)   Resp 20   LMP 04/26/2022 (Approximate)   SpO2 97%   Visual Acuity Right Eye Distance:   Left Eye Distance:   Bilateral Distance:    Right Eye Near:   Left Eye Near:    Bilateral Near:     Physical Exam Vitals and nursing note reviewed.  Constitutional:      General: She is not in acute distress.    Appearance: Normal appearance. She is not toxic-appearing.  HENT:     Mouth/Throat:     Mouth: Mucous membranes are moist.     Pharynx: Oropharynx is clear.  Pulmonary:     Effort: Pulmonary effort is normal. No respiratory distress.  Musculoskeletal:     Thoracic back: Tenderness present. No swelling, edema, deformity, spasms or bony tenderness. Normal range of motion.       Back:     Right lower leg: No edema.     Left lower leg: No edema.     Comments: Tender to palpation in the areas marked.  No erythema, bruising, fluctuance, warmth, or edema.    Skin:    General: Skin is warm and dry.     Capillary Refill: Capillary refill takes less than 2 seconds.     Coloration: Skin is not jaundiced or pale.     Findings: No erythema.  Neurological:     Mental Status: She  is alert and oriented to person, place, and time.  Psychiatric:        Behavior: Behavior is cooperative.      UC Treatments / Results  Labs (all labs ordered are listed, but only abnormal results are displayed) Labs Reviewed - No data to display  EKG   Radiology DG Thoracic Spine 2  View  Result Date: 04/26/2022 CLINICAL DATA:  Mid to low back pain intermittently radiating into the right leg since jumping off a forklift a week ago. EXAM: THORACIC SPINE 2 VIEWS COMPARISON:  Chest x-ray dated April 01, 2019. FINDINGS: Twelve rib-bearing thoracic vertebral bodies. No acute fracture or subluxation. Vertebral body heights are preserved. Alignment is normal. Intervertebral disc spaces are maintained. Prior lower cervical ACDF. IMPRESSION: 1. Negative. Electronically Signed   By: Obie Dredge M.D.   On: 04/26/2022 15:51    Procedures Procedures (including critical care time)  Medications Ordered in UC Medications - No data to display  Initial Impression / Assessment and Plan / UC Course  I have reviewed the triage vital signs and the nursing notes.  Pertinent labs & imaging results that were available during my care of the patient were reviewed by me and considered in my medical decision making (see chart for details).  Prior to visit beginning, I asked patient if she was going to be moving forward with worker's compensation or occupational health for the possible work related injury.  She reported she is waiting on her claim number and wishes to be seen in urgent care today and not occupational health.   Patient is well-appearing, normotensive, afebrile, not tachycardic, not tachypneic, oxygenating well on room air.    Acute bilateral thoracic back pain Thoracic imaging negative for acute abnormality No red flags in history or on examination Supportive care discussed including continuing ibuprofen She can also start warm compresses Start tizanidine as needed for muscular pain Discussed with patient that future visits for this issue should be done through occupational health given this was a work injury; patient understands and is agreeable   The patient was given the opportunity to ask questions.  All questions answered to their satisfaction.  The patient is in  agreement to this plan.    Final Clinical Impressions(s) / UC Diagnoses   Final diagnoses:  Acute bilateral thoracic back pain     Discharge Instructions      The back x-ray today does not show any acute abnormalities.  In addition to ibuprofen, start muscle relaxant as needed for pain.  You can also use warm compresses and light stretching/range of motion exercises.  Seek care if symptoms persist or worsen despite treatment.    ED Prescriptions     Medication Sig Dispense Auth. Provider   tiZANidine (ZANAFLEX) 4 MG tablet Take 1 tablet (4 mg total) by mouth every 8 (eight) hours as needed for muscle spasms. Do not take with alcohol or while driving or operating heavy machinery.  May cause drowsiness. 30 tablet Valentino Nose, NP      PDMP not reviewed this encounter.   Valentino Nose, NP 04/26/22 [phone]

## 2022-06-30 ENCOUNTER — Other Ambulatory Visit (HOSPITAL_COMMUNITY): Payer: Self-pay | Admitting: Family Medicine

## 2022-06-30 DIAGNOSIS — Z1331 Encounter for screening for depression: Secondary | ICD-10-CM | POA: Diagnosis not present

## 2022-06-30 DIAGNOSIS — Z0000 Encounter for general adult medical examination without abnormal findings: Secondary | ICD-10-CM | POA: Diagnosis not present

## 2022-06-30 DIAGNOSIS — F419 Anxiety disorder, unspecified: Secondary | ICD-10-CM | POA: Diagnosis not present

## 2022-06-30 DIAGNOSIS — E119 Type 2 diabetes mellitus without complications: Secondary | ICD-10-CM | POA: Diagnosis not present

## 2022-06-30 DIAGNOSIS — Z6841 Body Mass Index (BMI) 40.0 and over, adult: Secondary | ICD-10-CM | POA: Diagnosis not present

## 2022-06-30 DIAGNOSIS — R7309 Other abnormal glucose: Secondary | ICD-10-CM | POA: Diagnosis not present

## 2022-06-30 DIAGNOSIS — Z1231 Encounter for screening mammogram for malignant neoplasm of breast: Secondary | ICD-10-CM

## 2022-06-30 DIAGNOSIS — Z800 Family history of malignant neoplasm of digestive organs: Secondary | ICD-10-CM | POA: Diagnosis not present

## 2022-06-30 DIAGNOSIS — F32A Depression, unspecified: Secondary | ICD-10-CM | POA: Diagnosis not present

## 2022-07-08 ENCOUNTER — Ambulatory Visit (HOSPITAL_COMMUNITY): Payer: Federal, State, Local not specified - PPO

## 2022-07-20 ENCOUNTER — Ambulatory Visit (HOSPITAL_COMMUNITY): Payer: Federal, State, Local not specified - PPO

## 2022-10-14 ENCOUNTER — Other Ambulatory Visit (HOSPITAL_COMMUNITY): Payer: Self-pay | Admitting: Family Medicine

## 2022-10-14 DIAGNOSIS — Z1231 Encounter for screening mammogram for malignant neoplasm of breast: Secondary | ICD-10-CM

## 2022-10-27 ENCOUNTER — Ambulatory Visit (HOSPITAL_COMMUNITY): Payer: Federal, State, Local not specified - PPO

## 2023-05-29 ENCOUNTER — Other Ambulatory Visit (HOSPITAL_COMMUNITY)
Admission: RE | Admit: 2023-05-29 | Discharge: 2023-05-29 | Disposition: A | Discharge status: Home / Self Care | Payer: BLUE CROSS/BLUE SHIELD | Source: Ambulatory Visit | Attending: Adult Health | Admitting: Adult Health

## 2023-05-29 ENCOUNTER — Ambulatory Visit: Payer: BLUE CROSS/BLUE SHIELD | Admitting: Adult Health

## 2023-05-29 ENCOUNTER — Encounter: Payer: Self-pay | Admitting: Adult Health

## 2023-05-29 VITALS — BP 111/66 | HR 86 | Ht 61.0 in | Wt 211.0 lb

## 2023-05-29 DIAGNOSIS — B3731 Acute candidiasis of vulva and vagina: Secondary | ICD-10-CM | POA: Diagnosis not present

## 2023-05-29 DIAGNOSIS — B9689 Other specified bacterial agents as the cause of diseases classified elsewhere: Secondary | ICD-10-CM | POA: Diagnosis not present

## 2023-05-29 DIAGNOSIS — R309 Painful micturition, unspecified: Secondary | ICD-10-CM | POA: Diagnosis not present

## 2023-05-29 DIAGNOSIS — N898 Other specified noninflammatory disorders of vagina: Secondary | ICD-10-CM | POA: Insufficient documentation

## 2023-05-29 DIAGNOSIS — R300 Dysuria: Secondary | ICD-10-CM | POA: Diagnosis not present

## 2023-05-29 DIAGNOSIS — R102 Pelvic and perineal pain unspecified side: Secondary | ICD-10-CM

## 2023-05-29 DIAGNOSIS — R350 Frequency of micturition: Secondary | ICD-10-CM | POA: Diagnosis not present

## 2023-05-29 DIAGNOSIS — N760 Acute vaginitis: Secondary | ICD-10-CM | POA: Diagnosis not present

## 2023-05-29 LAB — POCT URINALYSIS DIPSTICK
Glucose, UA: NEGATIVE
Ketones, UA: NEGATIVE
Nitrite, UA: NEGATIVE
Protein, UA: NEGATIVE

## 2023-05-29 NOTE — Progress Notes (Signed)
  Subjective:     Patient ID: Katelyn Hill, female   DOB: 23-Jan-1981, 42 y.o.   MRN: [id number]  HPI Katelyn Hill is a 42 year old white female, married, [id number] in complaining of pain and swelling in vagina and urinary burning and frequency on and off. She wonders if left tampon in. Had sex and it was uncomfortable.     Component Value Date/Time   DIAGPAP  09/24/2021 1246    - Negative for Intraepithelial Lesions or Malignancy (NILM)   DIAGPAP - Benign reactive/reparative changes 09/24/2021 1246   HPVHIGH Negative 09/24/2021 1246   ADEQPAP  09/24/2021 1246    Satisfactory for evaluation; transformation zone component PRESENT.    PCP is Robbie Lis  Review of Systems +complaining of pain and swelling in vagina  + urinary burning and frequency on and off.  She wonders if left tampon in. Had sex and it was uncomfortable.   Reviewed past medical,surgical, social and family history. Reviewed medications and allergies.  Objective:   Physical Exam BP 111/66 (BP Location: Left Arm, Patient Position: Sitting, Cuff Size: Large)   Pulse 86   Ht 5\' 1"  (1.549 m)   Wt 211 lb (95.7 kg)   LMP 05/13/2023 (Approximate)   BMI 39.87 kg/m  urine dipstick trace blood and trace leuks Skin warm and dry.Pelvic: external genitalia is normal in appearance no lesions,inner left labia irritated, vagina: white discharge without odor,urethra has no lesions or masses noted, cervix:smooth, uterus: normal size, shape and contour, non tender, no masses felt, adnexa: no masses or tenderness noted. Bladder is non tender and no masses felt. CV swab obtained.   Fall risk is low  Upstream - 05/29/23 1600       Pregnancy Intention Screening   Does the patient want to become pregnant in the next year? No    Does the patient's partner want to become pregnant in the next year? No    Would the patient like to discuss contraceptive options today? No      Contraception Wrap Up   Current Method Female Sterilization    End  Method Female Sterilization    Contraception Counseling Provided No            Examination chaperoned by Faith Rogue LPN  Assessment:     1. Pain with urination UA C&S sent to rule out UTI - POCT Urinalysis Dipstick - Urine Culture - Urinalysis, Routine w reflex microscopic  2. Burning with urination - POCT Urinalysis Dipstick - Urine Culture - Urinalysis, Routine w reflex microscopic  3. Urinary frequency UF on and off  - POCT Urinalysis Dipstick - Urine Culture - Urinalysis, Routine w reflex microscopic  4. Vaginal discharge White discharge, without odor CV swab sent for GC/CHL,trich,BV and yeast  - Cervicovaginal ancillary only( [place])  5. Vaginal pain (Primary)  No sex for now     Plan:     Follow up prn

## 2023-05-30 ENCOUNTER — Telehealth: Payer: Self-pay | Admitting: Adult Health

## 2023-05-30 LAB — URINALYSIS, ROUTINE W REFLEX MICROSCOPIC
Bilirubin, UA: NEGATIVE
Glucose, UA: NEGATIVE
Ketones, UA: NEGATIVE
Nitrite, UA: POSITIVE — AB
RBC, UA: NEGATIVE
Specific Gravity, UA: 1.021 (ref 1.005–1.030)
Urobilinogen, Ur: 0.2 mg/dL (ref 0.2–1.0)
pH, UA: 7 (ref 5.0–7.5)

## 2023-05-30 LAB — MICROSCOPIC EXAMINATION
Casts: NONE SEEN /[LPF]
RBC, Urine: NONE SEEN /[HPF] (ref 0–2)

## 2023-05-30 NOTE — Telephone Encounter (Signed)
Patient called concerned about lab results. Advised patient that some of the labs hadn't been released yet to [place] and you will probably wait til they all resulted before you document on it. She wants to get clarification on the labs.  Please advise.

## 2023-06-01 ENCOUNTER — Telehealth: Payer: Self-pay | Admitting: Adult Health

## 2023-06-01 LAB — CERVICOVAGINAL ANCILLARY ONLY
Bacterial Vaginitis (gardnerella): POSITIVE — AB
Candida Glabrata: NEGATIVE
Candida Vaginitis: POSITIVE — AB
Chlamydia: NEGATIVE
Comment: NEGATIVE
Comment: NEGATIVE
Comment: NEGATIVE
Comment: NEGATIVE
Comment: NEGATIVE
Comment: NORMAL
Neisseria Gonorrhea: NEGATIVE
Trichomonas: NEGATIVE

## 2023-06-01 LAB — URINE CULTURE

## 2023-06-01 MED ORDER — METRONIDAZOLE 500 MG PO TABS: 500.0000 mg | ORAL_TABLET | Freq: Two times a day (BID) | ORAL | 0 refills | Status: AC

## 2023-06-01 MED ORDER — NITROFURANTOIN MONOHYD MACRO 100 MG PO CAPS: 100.0000 mg | ORAL_CAPSULE | Freq: Two times a day (BID) | ORAL | 0 refills | Status: AC

## 2023-06-01 MED ORDER — FLUCONAZOLE 150 MG PO TABS: ORAL_TABLET | ORAL | 1 refills | Status: AC

## 2023-06-01 NOTE — Telephone Encounter (Signed)
 Pt aware had BV and yeast on vaginal swab and urine + Ecoli, will rx flagyl,diflucan and Macrobid, no sex or alcohol, while taking meds

## 2023-06-01 NOTE — Telephone Encounter (Signed)
Patient called to discuss lab results. Please advise.

## 2023-06-01 NOTE — Addendum Note (Signed)
 Addended by: Cyril Mourning A on: 06/01/2023 05:03 PM   Modules accepted: Orders

## 2023-11-01 ENCOUNTER — Encounter (INDEPENDENT_AMBULATORY_CARE_PROVIDER_SITE_OTHER): Payer: Self-pay | Admitting: *Deleted

## 2023-11-01 ENCOUNTER — Other Ambulatory Visit (HOSPITAL_COMMUNITY): Payer: Self-pay | Admitting: Internal Medicine

## 2023-11-01 DIAGNOSIS — Z1231 Encounter for screening mammogram for malignant neoplasm of breast: Secondary | ICD-10-CM

## 2024-03-05 ENCOUNTER — Encounter (HOSPITAL_COMMUNITY): Payer: Self-pay

## 2024-03-05 ENCOUNTER — Emergency Department (HOSPITAL_COMMUNITY)
Admission: EM | Admit: 2024-03-05 | Discharge: 2024-03-05 | Disposition: A | Discharge status: Home / Self Care | Source: Ambulatory Visit | Attending: Emergency Medicine | Admitting: Emergency Medicine

## 2024-03-05 ENCOUNTER — Other Ambulatory Visit: Payer: Self-pay

## 2024-03-05 ENCOUNTER — Emergency Department (HOSPITAL_COMMUNITY)

## 2024-03-05 DIAGNOSIS — Z87891 Personal history of nicotine dependence: Secondary | ICD-10-CM | POA: Diagnosis not present

## 2024-03-05 DIAGNOSIS — R222 Localized swelling, mass and lump, trunk: Secondary | ICD-10-CM | POA: Insufficient documentation

## 2024-03-05 NOTE — Discharge Instructions (Signed)
 Thank you for coming to [facility] Emergency Department. You were seen for a lump in the soft tissues of your right upper back. Please follow up with your primary care provider within 1 week. You can go to [employer] to find a new primary care provider and search for first available and by location. It is most likely that this is a benign fatty tumor, but further workup can be done as an outpatient.   Do not hesitate to return to the ED or call 911 if you experience: -Worsening symptoms -Chest pain, shortness of breath -Coughing up blood -Lightheadedness, passing out -Fevers/chills -Anything else that concerns you

## 2024-03-05 NOTE — ED Provider Notes (Signed)
 [place] EMERGENCY DEPARTMENT AT [facility] Provider Note   CSN: [id number] Arrival date & time: 03/05/24  1912     History  Chief Complaint  Patient presents with   lump on ribs    Katelyn Hill is a 43 y.o. female with PMH as listed below who presents with a lump in the skin on the right upper back/posterior axilla.  Patient noticed this lump today and went to urgent care who told her to come to the emergency department for CT scan.  The area is not painful and she is not sure when it started but it is larger than a golf ball.  Patient is very concerned that it may be cancer. Patient has a history of smoking but denies any shortness of breath, chest pain, hemoptysis, nausea vomiting abdominal pain diarrhea, urinary symptoms, fatigue, fever/chills.  She recently started a job that involves a lot of walking and has lost approximately 30 pounds.  She feels very well.  No trauma to the area.  She has not noticed any breast lumps.   Past Medical History:  Diagnosis Date   Chronic pain    Menorrhagia    Migraine    Psoriasis        Home Medications Prior to Admission medications   Medication Sig Start Date End Date Taking? Authorizing Provider  clobetasol ointment (TEMOVATE) 0.05 % Apply topically 2 (two) times daily. 01/10/20   [provider]  famotidine  (PEPCID ) 20 MG tablet Take 1 tablet (20 mg total) by mouth 2 (two) times daily for 7 days. 04/25/21 05/02/21  Arloa Suzen RAMAN, NP  fluconazole  (DIFLUCAN ) 150 MG tablet Take 1 now and 1 in 3 days 06/01/23   Signa Delon LABOR, NP  metroNIDAZOLE  (FLAGYL ) 500 MG tablet Take 1 tablet (500 mg total) by mouth 2 (two) times daily. 06/01/23   Signa Delon LABOR, NP  Naproxen  Sodium (ALEVE  PO) Take by mouth.    [provider]  nitrofurantoin , macrocrystal-monohydrate, (MACROBID ) 100 MG capsule Take 1 capsule (100 mg total) by mouth 2 (two) times daily. 06/01/23   Signa Delon LABOR, NP      Allergies     Patient has no known allergies.    Review of Systems   Review of Systems A 10 point review of systems was performed and is negative unless otherwise reported in HPI.  Physical Exam Updated Vital Signs BP (!) 142/85 (BP Location: Right Arm)   Pulse 70   Temp 98.1 F (36.7 C) (Oral)   Resp 16   Wt 93 kg   LMP 03/05/2024   SpO2 99%   BMI 38.73 kg/m  Physical Exam General: Normal appearing female, sitting in bed.  HEENT: PERRLA, Sclera anicteric, MMM, trachea midline.  Cardiology: RRR Resp: Normal respiratory rate and effort. CTAB, no wheezes, rhonchi, crackles.  Abd: Soft, non-tender, non-distended. No rebound tenderness or guarding.  GU: Deferred. MSK: No peripheral edema or signs of trauma. Extremities without deformity or TTP.  Skin: warm, dry.  Back: No CVA tenderness.  Approximately 20 x 10 cm nodular area in the soft tissues of the right middle back/posterior axilla.  Nontender to palpation, no erythema, induration, fluctuance.  No crepitus.  Area is circumscribed and mobile. Neuro: A&Ox4, CNs II-XII grossly intact. MAEs. Sensation grossly intact.  Psych: Normal mood and affect.   ED Results / Procedures / Treatments   Labs (all labs ordered are listed, but only abnormal results are displayed) Labs Reviewed - No data to display  EKG None  Radiology DG Ribs Unilateral W/Chest Right Result Date: 03/05/2024 CLINICAL DATA:  Palpable abnormality in the right chest wall EXAM: RIGHT RIBS AND CHEST - 3+ VIEW COMPARISON:  03/11/2020 FINDINGS: Cardiac shadow is stable. Postsurgical changes in the cervical spine are noted. The lungs are well aerated bilaterally. No focal infiltrate or effusion is seen. No pneumothorax is noted. No rib abnormality is noted to correspond with the given clinical history. IMPRESSION: No acute rib abnormality noted on this exam. Electronically Signed   By: Oneil Devonshire M.D.   On: 03/05/2024 20:23    Procedures Procedures    Medications Ordered in  ED Medications - No data to display  ED Course/ Medical Decision Making/ A&P                          Medical Decision Making Amount and/or Complexity of Data Reviewed Radiology: ordered.    This patient presents to the ED for concern of soft tissue lump, this involves an extensive number of treatment options, and is a complaint that carries with it a high risk of complications and morbidity.  Patient is hemodynamically stable and very well-appearing.  MDM:    This area in the patient's soft tissues of her right upper back/posterior axilla likely represents a benign fatty tumor.  There is no induration fluctuance or tenderness palpation that would indicate soft tissue infection such as cellulitis or abscess.  The area is posterior from her breast tissue and likely does not involve the breast at all.  She has no abdominal pain or tenderness palpation to indicate an intra abdominal process, and no shortness of breath cough chest pain or hemoptysis that would indicate an intrathoracic process.  The x-ray does not demonstrate any abnormalities.  No trauma or tenderness palpation that would indicate a rib fracture or hematoma.  She has no B symptoms and has no other symptoms, feels very well, I doubt a malignancy.  I discussed with the patient that this likely represents a benign fatty tumor and that soft tissue cancers are extremely rare.  Urgent care had recommended a CT scan, which I did offer to the patient, but I reassured that this can be worked up as an outpatient and patient agrees after shared decision making to an outpatient workup.  Patient does not currently have a primary care physician and I recommend that she look on [facility] website and call to make an appointment tomorrow for within the next 1 to 2 weeks.     Imaging Studies ordered: Rib x-ray ordered from triage I independently visualized and interpreted imaging. I agree with the radiologist interpretation  Additional history  obtained from chart review, husband at bedside.  Social Determinants of Health:  lives independently  Disposition:  DC w/ discharge instructions/return precautions. All questions answered to patient's satisfaction.    Co morbidities that complicate the patient evaluation  Past Medical History:  Diagnosis Date   Chronic pain    Menorrhagia    Migraine    Psoriasis      Medicines No orders of the defined types were placed in this encounter.   I have reviewed the patients home medicines and have made adjustments as needed  Problem List / ED Course: Problem List Items Addressed This Visit   None Visit Diagnoses       Lump of skin of back    -  Primary  This note was created using dictation software, which may contain spelling or grammatical errors.    Franklyn Sid SAILOR, MD 03/05/24 [phone]

## 2024-03-05 NOTE — ED Triage Notes (Signed)
 Pt reports she discovered a lump on her right side ribcage and UC told her to come to the ER.

## 2024-03-08 ENCOUNTER — Other Ambulatory Visit (HOSPITAL_COMMUNITY): Payer: Self-pay | Admitting: Internal Medicine

## 2024-03-08 DIAGNOSIS — Z1231 Encounter for screening mammogram for malignant neoplasm of breast: Secondary | ICD-10-CM

## 2024-03-13 ENCOUNTER — Ambulatory Visit (HOSPITAL_COMMUNITY)

## 2024-05-07 ENCOUNTER — Encounter (INDEPENDENT_AMBULATORY_CARE_PROVIDER_SITE_OTHER): Payer: Self-pay | Admitting: *Deleted
